# Patient Record
Sex: Female | Born: 2000 | Race: White | Hispanic: No | Marital: Single | Smoking: Never smoker
Health system: Southern US, Community
[De-identification: ages and names within clinical notes are randomized; demographics above are authoritative.]

## PROBLEM LIST (undated history)

## (undated) ENCOUNTER — Inpatient Hospital Stay (HOSPITAL_COMMUNITY): Payer: Self-pay

## (undated) DIAGNOSIS — H6690 Otitis media, unspecified, unspecified ear: Secondary | ICD-10-CM

## (undated) DIAGNOSIS — F411 Generalized anxiety disorder: Secondary | ICD-10-CM

## (undated) DIAGNOSIS — L309 Dermatitis, unspecified: Secondary | ICD-10-CM

## (undated) DIAGNOSIS — J45909 Unspecified asthma, uncomplicated: Secondary | ICD-10-CM

## (undated) DIAGNOSIS — F909 Attention-deficit hyperactivity disorder, unspecified type: Secondary | ICD-10-CM

## (undated) HISTORY — DX: Unspecified asthma, uncomplicated: J45.909

## (undated) HISTORY — DX: Generalized anxiety disorder: F41.1

## (undated) HISTORY — PX: TONSILLECTOMY: SUR1361

---
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
## 2020-08-10 ENCOUNTER — Telehealth: Payer: Medicaid Other | Admitting: Emergency Medicine

## 2020-08-10 DIAGNOSIS — J069 Acute upper respiratory infection, unspecified: Secondary | ICD-10-CM | POA: Diagnosis not present

## 2020-08-10 MED ORDER — BENZONATATE 100 MG PO CAPS: 100.0000 mg | ORAL_CAPSULE | Freq: Two times a day (BID) | ORAL | 0 refills | Status: DC | PRN

## 2020-08-10 MED ORDER — FLUTICASONE PROPIONATE 50 MCG/ACT NA SUSP: 2.0000 | Freq: Every day | NASAL | 0 refills | Status: DC

## 2020-08-10 NOTE — Progress Notes (Signed)

## 2020-08-17 ENCOUNTER — Other Ambulatory Visit: Payer: Self-pay | Admitting: Physician Assistant

## 2020-08-17 ENCOUNTER — Telehealth: Payer: Medicaid Other | Admitting: Physician Assistant

## 2020-08-17 DIAGNOSIS — R0989 Other specified symptoms and signs involving the circulatory and respiratory systems: Secondary | ICD-10-CM

## 2020-08-17 DIAGNOSIS — B9689 Other specified bacterial agents as the cause of diseases classified elsewhere: Secondary | ICD-10-CM

## 2020-08-17 DIAGNOSIS — R059 Cough, unspecified: Secondary | ICD-10-CM

## 2020-08-17 DIAGNOSIS — J208 Acute bronchitis due to other specified organisms: Secondary | ICD-10-CM

## 2020-08-17 MED ORDER — ALBUTEROL SULFATE HFA 108 (90 BASE) MCG/ACT IN AERS: 2.0000 | INHALATION_SPRAY | Freq: Four times a day (QID) | RESPIRATORY_TRACT | 0 refills | Status: DC | PRN

## 2020-08-17 MED ORDER — ALBUTEROL SULFATE HFA 108 (90 BASE) MCG/ACT IN AERS: 1.0000 | INHALATION_SPRAY | Freq: Four times a day (QID) | RESPIRATORY_TRACT | 0 refills | Status: DC | PRN

## 2020-08-17 MED ORDER — PREDNISONE 10 MG (21) PO TBPK: ORAL_TABLET | ORAL | 0 refills | Status: DC

## 2020-08-17 MED ORDER — BENZONATATE 100 MG PO CAPS: 100.0000 mg | ORAL_CAPSULE | Freq: Three times a day (TID) | ORAL | 0 refills | Status: DC | PRN

## 2020-08-17 MED ORDER — AZITHROMYCIN 250 MG PO TABS: ORAL_TABLET | ORAL | 0 refills | Status: DC

## 2020-08-17 NOTE — Addendum Note (Signed)
Addended by: Margaretann Loveless on: 08/17/2020 10:15 AM   Modules accepted: Orders

## 2020-08-17 NOTE — Progress Notes (Signed)
We are sorry that you are not feeling well.  Here is how we plan to help!  Based on your presentation I believe you most likely have A cough due to bacteria.  When patients have a fever and a productive cough with a change in color or increased sputum production, we are concerned about bacterial bronchitis.  If left untreated it can progress to pneumonia.  If your symptoms do not improve with your treatment plan it is important that you contact your provider.   I have prescribed Azithromyin 250 mg: two tablets now and then one tablet daily for 4 additonal days    In addition you may use A prescription cough medication called Tessalon Perles 100mg . You may take 1-2 capsules every 8 hours as needed for your cough.  Prednisone 10 mg daily for 6 days (see taper instructions below)  Directions for 6 day taper: Day 1: 2 tablets before breakfast, 1 after both lunch & dinner and 2 at bedtime Day 2: 1 tab before breakfast, 1 after both lunch & dinner and 2 at bedtime Day 3: 1 tab at each meal & 1 at bedtime Day 4: 1 tab at breakfast, 1 at lunch, 1 at bedtime Day 5: 1 tab at breakfast & 1 tab at bedtime Day 6: 1 tab at breakfast   From your responses in the eVisit questionnaire you describe inflammation in the upper respiratory tract which is causing a significant cough.  This is commonly called Bronchitis and has four common causes:    Allergies  Viral Infections  Acid Reflux  Bacterial Infection Allergies, viruses and acid reflux are treated by controlling symptoms or eliminating the cause. An example might be a cough caused by taking certain blood pressure medications. You stop the cough by changing the medication. Another example might be a cough caused by acid reflux. Controlling the reflux helps control the cough.  USE OF BRONCHODILATOR ("RESCUE") INHALERS: There is a risk from using your bronchodilator too frequently.  The risk is that over-reliance on a medication which only relaxes the  muscles surrounding the breathing tubes can reduce the effectiveness of medications prescribed to reduce swelling and congestion of the tubes themselves.  Although you feel brief relief from the bronchodilator inhaler, your asthma may actually be worsening with the tubes becoming more swollen and filled with mucus.  This can delay other crucial treatments, such as oral steroid medications. If you need to use a bronchodilator inhaler daily, several times per day, you should discuss this with your provider.  There are probably better treatments that could be used to keep your asthma under control.     HOME CARE . Only take medications as instructed by your medical team. . Complete the entire course of an antibiotic. . Drink plenty of fluids and get plenty of rest. . Avoid close contacts especially the very young and the elderly . Cover your mouth if you cough or cough into your sleeve. . Always remember to wash your hands . A steam or ultrasonic humidifier can help congestion.   GET HELP RIGHT AWAY IF: . You develop worsening fever. . You become short of breath . You cough up blood. . Your symptoms persist after you have completed your treatment plan MAKE SURE YOU   Understand these instructions.  Will watch your condition.  Will get help right away if you are not doing well or get worse.  Your e-visit answers were reviewed by a board certified advanced clinical practitioner to complete your  personal care plan.  Depending on the condition, your plan could have included both over the counter or prescription medications. If there is a problem please reply  once you have received a response from your provider. Your safety is important to us.  If you have drug allergies check your prescription carefully.    You can use MyChart to ask questions about today's visit, request a non-urgent call back, or ask for a work or school excuse for 24 hours related to this e-Visit. If it has been greater than  24 hours you will need to follow up with your provider, or enter a new e-Visit to address those concerns. You will get an e-mail in the next two days asking about your experience.  I hope that your e-visit has been valuable and will speed your recovery. Thank you for using e-visits.  I provided 6 minutes of non face-to-face time during this encounter for chart review and documentation.   

## 2020-12-12 ENCOUNTER — Encounter: Payer: Self-pay | Admitting: Family Medicine
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
## 2020-12-19 ENCOUNTER — Other Ambulatory Visit: Payer: Self-pay

## 2020-12-19 ENCOUNTER — Telehealth (INDEPENDENT_AMBULATORY_CARE_PROVIDER_SITE_OTHER): Payer: Medicaid Other | Admitting: Family Medicine

## 2020-12-19 DIAGNOSIS — F411 Generalized anxiety disorder: Secondary | ICD-10-CM | POA: Diagnosis not present

## 2020-12-19 MED ORDER — VENLAFAXINE HCL ER 75 MG PO CP24: 75.0000 mg | ORAL_CAPSULE | Freq: Every day | ORAL | 3 refills | Status: DC

## 2020-12-24 ENCOUNTER — Telehealth: Payer: Self-pay | Admitting: Family Medicine

## 2020-12-24 NOTE — Telephone Encounter (Signed)
Patient states she has an UTI but can't come in until possibly Wednesday if not later (due to her kids starting school), She is wondering if she can get something for it without having to come in. Or if she needs to come in, could she get something until she is able to. Please advise.

## 2020-12-25 NOTE — Telephone Encounter (Signed)
Called pt to schedule an appointment and she stated she would call back to schedule it.

## 2020-12-31 ENCOUNTER — Telehealth: Payer: Medicaid Other | Admitting: Physician Assistant

## 2020-12-31 DIAGNOSIS — J069 Acute upper respiratory infection, unspecified: Secondary | ICD-10-CM

## 2020-12-31 MED ORDER — BENZONATATE 100 MG PO CAPS: 100.0000 mg | ORAL_CAPSULE | Freq: Three times a day (TID) | ORAL | 0 refills | Status: DC | PRN

## 2020-12-31 MED ORDER — IPRATROPIUM BROMIDE 0.03 % NA SOLN: 2.0000 | Freq: Two times a day (BID) | NASAL | 12 refills | Status: DC

## 2020-12-31 NOTE — Progress Notes (Signed)
E-Visit for Upper Respiratory Infection   We are sorry you are not feeling well.  Here is how we plan to help!  Based on what you have shared with me, it looks like you may have a viral upper respiratory infection.  Upper respiratory infections are caused by a large number of viruses; however, rhinovirus is the most common cause.   Symptoms vary from person to person, with common symptoms including sore throat, cough, fatigue or lack of energy and feeling of general discomfort.  A low-grade fever of up to 100.4 may present, but is often uncommon.  Symptoms vary however, and are closely related to a person's age or underlying illnesses.  The most common symptoms associated with an upper respiratory infection are nasal discharge or congestion, cough, sneezing, headache and pressure in the ears and face.  These symptoms usually persist for about 3 to 10 days, but can last up to 2 weeks.  It is important to know that upper respiratory infections do not cause serious illness or complications in most cases.    Upper respiratory infections can be transmitted from person to person, with the most common method of transmission being a person's hands.  The virus is able to live on the skin and can infect other persons for up to 2 hours after direct contact.  Also, these can be transmitted when someone coughs or sneezes; thus, it is important to cover the mouth to reduce this risk.  To keep the spread of the illness at [place], good hand hygiene is very important.  This is an infection that is most likely caused by a virus. There are no specific treatments other than to help you with the symptoms until the infection runs its course.  We are sorry you are not feeling well.  Here is how we plan to help!   For nasal congestion, you may use an oral decongestants such as Mucinex D or if you have glaucoma or high blood pressure use plain Mucinex.  Saline nasal spray or nasal drops can help and can safely be used as often as  needed for congestion.  For your congestion, I have prescribed Ipratropium Bromide nasal spray 0.03% two sprays in each nostril 2-3 times a day  If you do not have a history of heart disease, hypertension, diabetes or thyroid disease, prostate/bladder issues or glaucoma, you may also use Sudafed to treat nasal congestion.  It is highly recommended that you consult with a pharmacist or your primary care physician to ensure this medication is safe for you to take.     If you have a cough, you may use cough suppressants such as Delsym and Robitussin.  If you have glaucoma or high blood pressure, you can also use Coricidin HBP.   For cough I have prescribed for you A prescription cough medication called Tessalon Perles 100 mg. You may take 1-2 capsules every 8 hours as needed for cough  If you have a sore or scratchy throat, use a saltwater gargle-  to  teaspoon of salt dissolved in a 4-ounce to 8-ounce glass of warm water.  Gargle the solution for approximately 15-30 seconds and then spit.  It is important not to swallow the solution.  You can also use throat lozenges/cough drops and Chloraseptic spray to help with throat pain or discomfort.  Warm or cold liquids can also be helpful in relieving throat pain.  For headache, pain or general discomfort, you can use Ibuprofen or Tylenol as directed.  Some authorities believe that zinc sprays or the use of Echinacea may shorten the course of your symptoms.  I would recommend to continue testing for Covid 19 over the next few days since you have had an exposure and are having symptoms.   HOME CARE Only take medications as instructed by your medical team. Be sure to drink plenty of fluids. Water is fine as well as fruit juices, sodas and electrolyte beverages. You may want to stay away from caffeine or alcohol. If you are nauseated, try taking small sips of liquids. How do you know if you are getting enough fluid? Your urine should be a pale yellow or  almost colorless. Get rest. Taking a steamy shower or using a humidifier may help nasal congestion and ease sore throat pain. You can place a towel over your head and breathe in the steam from hot water coming from a faucet. Using a saline nasal spray works much the same way. Cough drops, hard candies and sore throat lozenges may ease your cough. Avoid close contacts especially the very young and the elderly Cover your mouth if you cough or sneeze Always remember to wash your hands.   GET HELP RIGHT AWAY IF: You develop worsening fever. If your symptoms do not improve within 10 days You develop yellow or green discharge from your nose over 3 days. You have coughing fits You develop a severe head ache or visual changes. You develop shortness of breath, difficulty breathing or start having chest pain Your symptoms persist after you have completed your treatment plan  MAKE SURE YOU  Understand these instructions. Will watch your condition. Will get help right away if you are not doing well or get worse.  Thank you for choosing an e-visit.  Your e-visit answers were reviewed by a board certified advanced clinical practitioner to complete your personal care plan. Depending upon the condition, your plan could have included both over the counter or prescription medications.  Please review your pharmacy choice. Make sure the pharmacy is open so you can pick up prescription now. If there is a problem, you may contact your provider through [employer] and have the prescription routed to another pharmacy.  Your safety is important to [place]. If you have drug allergies check your prescription carefully.   For the next 24 hours you can use MyChart to ask questions about today's visit, request a non-urgent call back, or ask for a work or school excuse. You will get an email in the next two days asking about your experience. I hope that your e-visit has been valuable and will speed your  recovery.    I provided 5 minutes of non face-to-face time during this encounter for chart review and documentation.

## 2021-02-20 DIAGNOSIS — Z880 Allergy status to penicillin: Secondary | ICD-10-CM | POA: Diagnosis not present

## 2021-02-20 DIAGNOSIS — R000 Tachycardia, unspecified: Secondary | ICD-10-CM | POA: Diagnosis not present

## 2021-02-20 DIAGNOSIS — R0602 Shortness of breath: Secondary | ICD-10-CM | POA: Diagnosis not present

## 2021-02-20 DIAGNOSIS — R5381 Other malaise: Secondary | ICD-10-CM | POA: Diagnosis not present

## 2021-02-20 DIAGNOSIS — R059 Cough, unspecified: Secondary | ICD-10-CM | POA: Diagnosis not present

## 2021-02-20 DIAGNOSIS — Z20822 Contact with and (suspected) exposure to covid-19: Secondary | ICD-10-CM | POA: Diagnosis not present

## 2021-02-22 DIAGNOSIS — J101 Influenza due to other identified influenza virus with other respiratory manifestations: Secondary | ICD-10-CM | POA: Diagnosis not present

## 2021-02-22 DIAGNOSIS — Z20822 Contact with and (suspected) exposure to covid-19: Secondary | ICD-10-CM | POA: Diagnosis not present

## 2021-02-22 DIAGNOSIS — R0602 Shortness of breath: Secondary | ICD-10-CM | POA: Diagnosis not present

## 2021-02-22 DIAGNOSIS — R509 Fever, unspecified: Secondary | ICD-10-CM | POA: Diagnosis not present

## 2021-03-10 ENCOUNTER — Encounter: Payer: Self-pay | Admitting: Family Medicine

## 2021-03-11 DIAGNOSIS — F17200 Nicotine dependence, unspecified, uncomplicated: Secondary | ICD-10-CM | POA: Diagnosis not present

## 2021-03-11 DIAGNOSIS — F17210 Nicotine dependence, cigarettes, uncomplicated: Secondary | ICD-10-CM | POA: Diagnosis not present

## 2021-03-11 DIAGNOSIS — Z3200 Encounter for pregnancy test, result unknown: Secondary | ICD-10-CM | POA: Diagnosis not present

## 2021-03-11 DIAGNOSIS — Z6832 Body mass index (BMI) 32.0-32.9, adult: Secondary | ICD-10-CM | POA: Diagnosis not present

## 2021-03-11 DIAGNOSIS — N390 Urinary tract infection, site not specified: Secondary | ICD-10-CM | POA: Diagnosis not present

## 2021-03-11 DIAGNOSIS — N760 Acute vaginitis: Secondary | ICD-10-CM | POA: Diagnosis not present

## 2021-03-11 DIAGNOSIS — N3000 Acute cystitis without hematuria: Secondary | ICD-10-CM | POA: Diagnosis not present

## 2021-03-11 DIAGNOSIS — B9689 Other specified bacterial agents as the cause of diseases classified elsewhere: Secondary | ICD-10-CM | POA: Diagnosis not present

## 2021-03-11 DIAGNOSIS — Z7251 High risk heterosexual behavior: Secondary | ICD-10-CM | POA: Diagnosis not present

## 2021-03-12 ENCOUNTER — Encounter (HOSPITAL_BASED_OUTPATIENT_CLINIC_OR_DEPARTMENT_OTHER): Payer: Self-pay | Admitting: *Deleted

## 2021-03-12 ENCOUNTER — Emergency Department (HOSPITAL_BASED_OUTPATIENT_CLINIC_OR_DEPARTMENT_OTHER)
Admission: EM | Admit: 2021-03-12 | Discharge: 2021-03-13 | Disposition: A | Discharge status: Home / Self Care | Payer: Medicaid Other | Attending: Emergency Medicine | Admitting: Emergency Medicine

## 2021-03-12 ENCOUNTER — Encounter: Payer: Self-pay | Admitting: Family Medicine

## 2021-03-12 ENCOUNTER — Telehealth: Payer: Self-pay | Admitting: Family Medicine

## 2021-03-12 ENCOUNTER — Other Ambulatory Visit: Payer: Self-pay | Admitting: Family Medicine

## 2021-03-12 ENCOUNTER — Other Ambulatory Visit: Payer: Self-pay

## 2021-03-12 DIAGNOSIS — N83209 Unspecified ovarian cyst, unspecified side: Secondary | ICD-10-CM

## 2021-03-12 DIAGNOSIS — Z7951 Long term (current) use of inhaled steroids: Secondary | ICD-10-CM | POA: Diagnosis not present

## 2021-03-12 DIAGNOSIS — M545 Low back pain, unspecified: Secondary | ICD-10-CM | POA: Diagnosis not present

## 2021-03-12 DIAGNOSIS — R1110 Vomiting, unspecified: Secondary | ICD-10-CM | POA: Diagnosis not present

## 2021-03-12 DIAGNOSIS — N83201 Unspecified ovarian cyst, right side: Secondary | ICD-10-CM | POA: Diagnosis not present

## 2021-03-12 DIAGNOSIS — R350 Frequency of micturition: Secondary | ICD-10-CM | POA: Diagnosis present

## 2021-03-12 DIAGNOSIS — J45909 Unspecified asthma, uncomplicated: Secondary | ICD-10-CM | POA: Diagnosis not present

## 2021-03-12 DIAGNOSIS — R109 Unspecified abdominal pain: Secondary | ICD-10-CM | POA: Diagnosis not present

## 2021-03-12 LAB — URINALYSIS, ROUTINE W REFLEX MICROSCOPIC
Bilirubin Urine: NEGATIVE
Glucose, UA: NEGATIVE mg/dL
Ketones, ur: NEGATIVE mg/dL
Nitrite: NEGATIVE
Protein, ur: NEGATIVE mg/dL
Specific Gravity, Urine: 1.015 (ref 1.005–1.030)
pH: 6.5 (ref 5.0–8.0)

## 2021-03-12 LAB — URINALYSIS, MICROSCOPIC (REFLEX)

## 2021-03-12 LAB — PREGNANCY, URINE: Preg Test, Ur: NEGATIVE

## 2021-03-12 MED ORDER — KETOROLAC TROMETHAMINE 30 MG/ML IJ SOLN
30.0000 mg | Freq: Once | INTRAMUSCULAR | Status: AC
  Administered 2021-03-13: 30 mg via INTRAVENOUS
  Filled 2021-03-12: qty 1

## 2021-03-12 MED ORDER — LACTATED RINGERS IV BOLUS
1000.0000 mL | Freq: Once | INTRAVENOUS | Status: AC
  Administered 2021-03-13: 1000 mL via INTRAVENOUS

## 2021-03-12 MED ORDER — ONDANSETRON HCL 4 MG/2ML IJ SOLN
4.0000 mg | Freq: Once | INTRAMUSCULAR | Status: AC
  Administered 2021-03-13: 4 mg via INTRAVENOUS
  Filled 2021-03-12: qty 2

## 2021-03-12 MED ORDER — SULFAMETHOXAZOLE-TRIMETHOPRIM 800-160 MG PO TABS: 1.0000 | ORAL_TABLET | Freq: Two times a day (BID) | ORAL | 0 refills | Status: DC

## 2021-03-12 NOTE — ED Triage Notes (Signed)
Back pain x 2 weeks. She was seen by her MD and was started on antibiotics x 2 days for a severe UTI.

## 2021-03-12 NOTE — ED Notes (Signed)
Reports that she started having burning with urinating and frequency.  Pt. Reports it got worse and she saw Dr. On Monday and was placed on abx.  Pt. Reports today not feeling better only feeling worse.  Pt. Reports fever lastnight and feeling sick on her stomach with vomiting.  Pt. Reports L side of lower abd. Painful and swelling today.

## 2021-03-12 NOTE — ED Provider Notes (Signed)
[facility] EMERGENCY DEPARTMENT Provider Note   CSN: [id number] Arrival date & time: 03/12/21  2016     History Chief Complaint  Patient presents with   Back Pain    Cindy Townsend is a 20 y.o. female.  HPI     2 weeks ago developed, burning with urination, frequency, felt like it was thick, foul odor Monday went to [facility] center Diagnosed with UTI, started on nitrofurantoin, taken it for 2 days and feels like symptoms worsening For the last 2 days now having lower abdominal on the left side and feeling of fullness, pain the lower back on both sides, nausea, vomiting a few times, chills, last night 99. No diarrhea, or constipation, no vaginal discharge, no vaginal bleeding  Sexually active, not using condoms  Not have dysuria now as much but the pelvic pain and back pain is persistent   Past Medical History:  Diagnosis Date   ADHD (attention deficit hyperactivity disorder)    ADHD   Anxiety state 07/24/2014   Asthma    triggered by exercise and URI; prn inhaler   Chronic otitis media 05/2011    Patient Active Problem List   Diagnosis Date Noted   Normal labor 07/18/2018   Expectant parent prebirth pediatrician visit 06/22/2018   Pregnancy 11/15/2017   Contusion of left elbow 03/04/2016   Headache 05/27/2015   Pharyngitis 04/17/2015   Superficial phlebitis of arm 10/19/2014   Knee pain 09/24/2014   Anxiety state 07/24/2014   Allergic rhinitis 06/26/2014   Abdominal pain 02/27/2014   Encounter for repeat prescription of oral contraceptives 01/24/2014   Skin tag 01/24/2014   Conjunctivitis 05/13/2012   Urticaria 03/01/2012   Preventative health care 02/22/2012   ADD (attention deficit disorder) 02/22/2012   Asthma    Eczema     Past Surgical History:  Procedure Laterality Date   TONSILLECTOMY     TYMPANOSTOMY TUBE PLACEMENT  2012     OB History     Gravida  1   Para  1   Term  1   Preterm      AB      Living  1       SAB      IAB      Ectopic      Multiple  0   Live Births  1           Family History  Problem Relation Age of Onset   Asthma Mother    Anesthesia problems Mother        post-op nausea   Hypertension Maternal Grandmother    Asthma Maternal Grandmother    Diabetes Paternal Grandmother        type 2- controlled by diet    Social History   Tobacco Use   Smoking status: Never   Smokeless tobacco: Never  Vaping Use   Vaping Use: Never used  Substance Use Topics   Alcohol use: No   Drug use: No    Home Medications Prior to Admission medications   Medication Sig Start Date End Date Taking? Authorizing Provider  albuterol (PROAIR HFA) 108 (90 Base) MCG/ACT inhaler Inhale 1-2 puffs into the lungs every 6 (six) hours as needed for wheezing or shortness of breath. 08/17/20  Yes Burnette, Alessandra Bevels, PA-C  etonogestrel (NEXPLANON) 68 MG IMPL implant 1 each by Subdermal route once.   Yes [provider]  ipratropium (ATROVENT) 0.03 % nasal spray Place 2 sprays into both nostrils every  12 (twelve) hours. 12/31/20  Yes Margaretann Loveless, PA-C  nitrofurantoin (MACRODANTIN) 100 MG capsule Take 100 mg by mouth 2 (two) times daily. 03/11/21  Yes [provider]  venlafaxine XR (EFFEXOR XR) 75 MG 24 hr capsule Take 1 capsule (75 mg total) by mouth daily with breakfast. 12/19/20  Yes Copland, Gwenlyn Found, MD  sulfamethoxazole-trimethoprim (BACTRIM DS) 800-160 MG tablet Take 1 tablet by mouth 2 (two) times daily. 03/13/21   Palumbo, April, MD    Allergies    Ciprofloxacin, Penicillins, and Soap  Review of Systems   Review of Systems  Constitutional:  Positive for chills and fatigue. Negative for fever.  HENT:  Negative for sore throat.   Respiratory:  Negative for cough and shortness of breath.   Cardiovascular:  Negative for chest pain.  Gastrointestinal:  Positive for abdominal pain, nausea and vomiting.  Genitourinary:  Positive for dysuria and pelvic pain.  Negative for difficulty urinating, vaginal bleeding and vaginal discharge.  Musculoskeletal:  Positive for back pain. Negative for neck pain.  Skin:  Negative for rash.  Neurological:  Negative for syncope, weakness, light-headedness, numbness and headaches.   Physical Exam Updated Vital Signs BP 112/69 (BP Location: Right Arm)   Pulse 64   Temp 98.4 F (36.9 C) (Oral)   Resp 18   Ht 5\' 3"  (1.6 m)   Wt 56.7 kg   SpO2 99%   BMI 22.14 kg/m   Physical Exam Vitals and nursing note reviewed.  Constitutional:      General: She is not in acute distress.    Appearance: She is well-developed. She is not diaphoretic.  HENT:     Head: Normocephalic and atraumatic.  Eyes:     Conjunctiva/sclera: Conjunctivae normal.  Cardiovascular:     Rate and Rhythm: Normal rate and regular rhythm.     Heart sounds: Normal heart sounds. No murmur heard.   No friction rub. No gallop.  Pulmonary:     Effort: Pulmonary effort is normal. No respiratory distress.     Breath sounds: Normal breath sounds. No wheezing or rales.  Abdominal:     General: There is no distension.     Palpations: Abdomen is soft.     Tenderness: There is abdominal tenderness (reports some left side but has more significant tenderness right side). There is no guarding.  Musculoskeletal:        General: No tenderness.     Cervical back: Normal range of motion.  Skin:    General: Skin is warm and dry.     Findings: No erythema or rash.  Neurological:     Mental Status: She is alert and oriented to person, place, and time.    ED Results / Procedures / Treatments   Labs (all labs ordered are listed, but only abnormal results are displayed) Labs Reviewed  WET PREP, GENITAL - Abnormal; Notable for the following components:      Result Value   Clue Cells Wet Prep HPF POC PRESENT (*)    WBC, Wet Prep HPF POC >=10 (*)    All other components within normal limits  URINALYSIS, ROUTINE W REFLEX MICROSCOPIC - Abnormal; Notable  for the following components:   Hgb urine dipstick TRACE (*)    Leukocytes,Ua TRACE (*)    All other components within normal limits  URINALYSIS, MICROSCOPIC (REFLEX) - Abnormal; Notable for the following components:   Bacteria, UA RARE (*)    All other components within normal limits  COMPREHENSIVE METABOLIC PANEL -  Abnormal; Notable for the following components:   CO2 21 (*)    AST 14 (*)    All other components within normal limits  PREGNANCY, URINE  CBC WITH DIFFERENTIAL/PLATELET  LIPASE, BLOOD  GC/CHLAMYDIA PROBE AMP ([place]) NOT AT [facility]    EKG None  Radiology CT ABDOMEN PELVIS W CONTRAST  Result Date: 03/13/2021 CLINICAL DATA:  Abdominal pain, acute, nonlocalized. Back pain. Nausea, vomiting. EXAM: CT ABDOMEN AND PELVIS WITH CONTRAST TECHNIQUE: Multidetector CT imaging of the abdomen and pelvis was performed using the standard protocol following bolus administration of intravenous contrast. CONTRAST:  OMNIPAQUE IOHEXOL 300 MG/ML  SOLN COMPARISON:  None. FINDINGS: Lower chest: No acute abnormality. Hepatobiliary: No focal liver abnormality is seen. No gallstones, gallbladder wall thickening, or biliary dilatation. Pancreas: Unremarkable. No pancreatic ductal dilatation or surrounding inflammatory changes. Spleen: Normal in size without focal abnormality. Adrenals/Urinary Tract: The adrenal glands are unremarkable. The kidneys are normal in size and position. Minimal bilateral nonobstructing nephrolithiasis is present with punctate 1-2 mm nonobstructing calculi noted within the lower pole of the right kidney and interpolar region of the left kidney. The kidneys are otherwise unremarkable. The bladder is unremarkable. Stomach/Bowel: Stomach is within normal limits. Appendix appears normal. No evidence of bowel wall thickening, distention, or inflammatory changes. No free intraperitoneal gas. Vascular/Lymphatic: No significant vascular findings are present. No enlarged abdominal  or pelvic lymph nodes. Reproductive: A 3.3 cm simple appearing cyst is seen within the right ovary. The pelvic organs are otherwise unremarkable. Other: No abdominal wall hernia or abnormality. No abdominopelvic ascites. Musculoskeletal: No acute or significant osseous findings. IMPRESSION: No acute intra-abdominal pathology identified. No definite radiographic explanation for the patient's reported symptoms. Minimal bilateral nonobstructing nephrolithiasis. 3.3 cm right ovarian simple-appearing cyst. No follow-up imaging is recommended. Reference: JACR 2020 Feb;17(2):248-254 Electronically Signed   By: Helyn Numbers M.D.   On: 03/13/2021 01:08    Procedures Procedures   Medications Ordered in ED Medications  lactated ringers bolus 1,000 mL ( Intravenous Stopped 03/13/21 0134)  ondansetron (ZOFRAN) injection 4 mg (4 mg Intravenous Given 03/13/21 0026)  ketorolac (TORADOL) 30 MG/ML injection 30 mg (30 mg Intravenous Given 03/13/21 0026)  iohexol (OMNIPAQUE) 300 MG/ML solution 100 mL (100 mLs Intravenous Contrast Given 03/13/21 0049)    ED Course  I have reviewed the triage vital signs and the nursing notes.  Pertinent labs & imaging results that were available during my care of the patient were reviewed by me and considered in my medical decision making (see chart for details).    MDM Rules/Calculators/A&P                           Irena Reichmann female with history above presents with concern for 2 week of urinary symptoms now with lower abdominal pain, nausea, chills and back pain. Treatment with nitrofurantoin was initiated by PCP.  UA today is not consistent with urinary tract infection.  Pelvic exam without significant uterine tenderness, overall lower suspicion for PID. Pain not colicky, history not consistent with ovarian torsion.  Pregnancy test negative.  Does have RLQ tenderness on exam, will order CT abdomen pelvis for further evaluation. Signed out to Dr. Nicanor Alcon with imaging pending.     Final Clinical Impression(s) / ED Diagnoses Final diagnoses:  Cyst of ovary, unspecified laterality    Rx / DC Orders ED Discharge Orders          Ordered    sulfamethoxazole-trimethoprim (BACTRIM DS) 800-160  MG tablet  2 times daily        03/13/21 0133             Alvira Monday, MD 03/13/21 2355

## 2021-03-12 NOTE — Telephone Encounter (Signed)
Patient is calling because she had UTI symptoms that started 2 week or more ago, and on the 13th she started having a lot of back pain and got sick. Patient went to [facility] center and they gave her Macrodancin stating that she had a really bad UTI and this would help her. She states that she's on the third pill an the medicine is giving her bad headaches and she still has no relief. She was offered an appointment but she wants to see what Dr. Abner Greenspan recommends first. Please advice.

## 2021-03-13 ENCOUNTER — Emergency Department (HOSPITAL_BASED_OUTPATIENT_CLINIC_OR_DEPARTMENT_OTHER): Payer: Medicaid Other

## 2021-03-13 DIAGNOSIS — R1110 Vomiting, unspecified: Secondary | ICD-10-CM | POA: Diagnosis not present

## 2021-03-13 DIAGNOSIS — R109 Unspecified abdominal pain: Secondary | ICD-10-CM | POA: Diagnosis not present

## 2021-03-13 LAB — CBC WITH DIFFERENTIAL/PLATELET
Abs Immature Granulocytes: 0.02 10*3/uL (ref 0.00–0.07)
Basophils Absolute: 0 10*3/uL (ref 0.0–0.1)
Basophils Relative: 0 %
Eosinophils Absolute: 0.1 10*3/uL (ref 0.0–0.5)
Eosinophils Relative: 1 %
HCT: 39.7 % (ref 36.0–46.0)
Hemoglobin: 13.3 g/dL (ref 12.0–15.0)
Immature Granulocytes: 0 %
Lymphocytes Relative: 40 %
Lymphs Abs: 3.7 10*3/uL (ref 0.7–4.0)
MCH: 29.4 pg (ref 26.0–34.0)
MCHC: 33.5 g/dL (ref 30.0–36.0)
MCV: 87.8 fL (ref 80.0–100.0)
Monocytes Absolute: 0.4 10*3/uL (ref 0.1–1.0)
Monocytes Relative: 5 %
Neutro Abs: 5 10*3/uL (ref 1.7–7.7)
Neutrophils Relative %: 54 %
Platelets: 351 10*3/uL (ref 150–400)
RBC: 4.52 MIL/uL (ref 3.87–5.11)
RDW: 12.4 % (ref 11.5–15.5)
WBC: 9.2 10*3/uL (ref 4.0–10.5)
nRBC: 0 % (ref 0.0–0.2)

## 2021-03-13 LAB — COMPREHENSIVE METABOLIC PANEL
ALT: 15 U/L (ref 0–44)
AST: 14 U/L — ABNORMAL LOW (ref 15–41)
Albumin: 4.3 g/dL (ref 3.5–5.0)
Alkaline Phosphatase: 55 U/L (ref 38–126)
Anion gap: 9 (ref 5–15)
BUN: 12 mg/dL (ref 6–20)
CO2: 21 mmol/L — ABNORMAL LOW (ref 22–32)
Calcium: 9 mg/dL (ref 8.9–10.3)
Chloride: 105 mmol/L (ref 98–111)
Creatinine, Ser: 0.81 mg/dL (ref 0.44–1.00)
GFR, Estimated: >60 mL/min (ref >60)
Glucose, Bld: 87 mg/dL (ref 70–99)
Potassium: 3.5 mmol/L (ref 3.5–5.1)
Sodium: 135 mmol/L (ref 135–145)
Total Bilirubin: 0.6 mg/dL (ref 0.3–1.2)
Total Protein: 7.3 g/dL (ref 6.5–8.1)

## 2021-03-13 LAB — WET PREP, GENITAL
Sperm: NONE SEEN
Trich, Wet Prep: NONE SEEN
WBC, Wet Prep HPF POC: >=10 — AB (ref <10)
Yeast Wet Prep HPF POC: NONE SEEN

## 2021-03-13 LAB — LIPASE, BLOOD: Lipase: 31 U/L (ref 11–51)

## 2021-03-13 MED ORDER — IOHEXOL 300 MG/ML  SOLN
100.0000 mL | Freq: Once | INTRAMUSCULAR | Status: AC | PRN
  Administered 2021-03-13: 100 mL via INTRAVENOUS

## 2021-03-13 MED ORDER — SULFAMETHOXAZOLE-TRIMETHOPRIM 800-160 MG PO TABS: 1.0000 | ORAL_TABLET | Freq: Two times a day (BID) | ORAL | 0 refills | Status: DC

## 2021-03-13 NOTE — Telephone Encounter (Signed)
Lmv to see if she could do a vv on Friday

## 2021-03-14 ENCOUNTER — Telehealth: Payer: Self-pay

## 2021-03-14 LAB — GC/CHLAMYDIA PROBE AMP (~~LOC~~) NOT AT ARMC
Chlamydia: NEGATIVE
Comment: NEGATIVE
Comment: NORMAL
Neisseria Gonorrhea: NEGATIVE

## 2021-03-14 NOTE — Telephone Encounter (Signed)
Transition Care Management Follow-up Telephone Call Date of discharge and from where: 03/13/2021-[facility]  How have you been since you were released from the hospital? Patient stated she is doing fine. Patient was advised to schedule a follow up with PCP.  Any questions or concerns? No  Items Reviewed: Did the pt receive and understand the discharge instructions provided? Yes  Medications obtained and verified? Yes  Other? No  Any new allergies since your discharge? No  Dietary orders reviewed? No Do you have support at home? Yes   Home Care and Equipment/Supplies: Were home health services ordered? not applicable If so, what is the name of the agency? N/A  Has the agency set up a time to come to the patient's home? not applicable Were any new equipment or medical supplies ordered?  No What is the name of the medical supply agency? N/A Were you able to get the supplies/equipment? not applicable Do you have any questions related to the use of the equipment or supplies? No  Functional Questionnaire: (I = Independent and D = Dependent) ADLs: I  Bathing/Dressing- I  Meal Prep- I  Eating- I  Maintaining continence- I  Transferring/Ambulation- I  Managing Meds- I  Follow up appointments reviewed:  PCP Hospital f/u appt confirmed? No   Specialist Hospital f/u appt confirmed? No   Are transportation arrangements needed? No  If their condition worsens, is the pt aware to call PCP or go to the Emergency Dept.? Yes Was the patient provided with contact information for the PCP's office or ED? Yes Was to pt encouraged to call back with questions or concerns? Yes

## 2021-03-19 ENCOUNTER — Encounter: Payer: Self-pay | Admitting: Family Medicine

## 2021-03-19 ENCOUNTER — Telehealth: Payer: Self-pay | Admitting: Family Medicine

## 2021-03-19 NOTE — Telephone Encounter (Signed)
Patient is calling because she went to the emergency room with a UTI and a kidney infection. She states she's on the last of her antibiotics and she is still burning when she pees. She would like to know what else she could do. Please advice.

## 2021-03-20 NOTE — Telephone Encounter (Signed)
Patient was treated at ER and was given Bactrim DS.  Looks like no culture was done.

## 2021-03-20 NOTE — Telephone Encounter (Signed)
Dr. Patsy Lager sent patient a mychart message yesterday.

## 2021-05-17 ENCOUNTER — Other Ambulatory Visit: Payer: Self-pay

## 2021-05-17 ENCOUNTER — Emergency Department (HOSPITAL_COMMUNITY)
Admission: EM | Admit: 2021-05-17 | Discharge: 2021-05-18 | Disposition: A | Discharge status: Home / Self Care | Payer: Medicaid Other | Attending: Emergency Medicine | Admitting: Emergency Medicine

## 2021-05-17 ENCOUNTER — Encounter (HOSPITAL_COMMUNITY): Payer: Self-pay

## 2021-05-17 DIAGNOSIS — J45909 Unspecified asthma, uncomplicated: Secondary | ICD-10-CM | POA: Insufficient documentation

## 2021-05-17 DIAGNOSIS — R0602 Shortness of breath: Secondary | ICD-10-CM

## 2021-05-17 DIAGNOSIS — U071 COVID-19: Secondary | ICD-10-CM | POA: Diagnosis not present

## 2021-05-17 DIAGNOSIS — Z28310 Unvaccinated for covid-19: Secondary | ICD-10-CM | POA: Diagnosis not present

## 2021-05-17 DIAGNOSIS — R059 Cough, unspecified: Secondary | ICD-10-CM | POA: Diagnosis not present

## 2021-05-17 MED ORDER — ALBUTEROL SULFATE HFA 108 (90 BASE) MCG/ACT IN AERS
4.0000 | INHALATION_SPRAY | RESPIRATORY_TRACT | Status: AC
  Administered 2021-05-18: 4 via RESPIRATORY_TRACT
  Filled 2021-05-17: qty 6.7

## 2021-05-17 NOTE — ED Triage Notes (Signed)
Pt reports covid exposure and positive home test. Generalized body aches, nonproductive cough and mild s hob beginning  yesterday.

## 2021-05-18 ENCOUNTER — Emergency Department (HOSPITAL_COMMUNITY): Payer: Medicaid Other

## 2021-05-18 DIAGNOSIS — R059 Cough, unspecified: Secondary | ICD-10-CM | POA: Diagnosis not present

## 2021-05-18 DIAGNOSIS — U071 COVID-19: Secondary | ICD-10-CM | POA: Diagnosis not present

## 2021-05-18 LAB — RESP PANEL BY RT-PCR (FLU A&B, COVID) ARPGX2
Influenza A by PCR: NEGATIVE
Influenza B by PCR: NEGATIVE
SARS Coronavirus 2 by RT PCR: POSITIVE — AB

## 2021-05-18 NOTE — ED Provider Notes (Signed)
[facility]-EMERGENCY DEPT Provider Note   CSN: [id number] Arrival date & time: 05/17/21  2328     History  Chief Complaint  Patient presents with   Covid Exposure    Cindy Townsend is a 21 y.o. female.  HPI    21 year old female comes in with chief complaint of COVID-19.  She reports about 2 to 3-day history of runny nose and URI-like symptoms.  Earlier today she started having some shortness of breath.  She has history of asthma.  She is certain she is not pregnant.  She is not vaccinated against COVID-19.  She also reports body aches, cough.  No treatment taken at home  Home Medications Prior to Admission medications   Medication Sig Start Date End Date Taking? Authorizing Provider  albuterol (PROAIR HFA) 108 (90 Base) MCG/ACT inhaler Inhale 1-2 puffs into the lungs every 6 (six) hours as needed for wheezing or shortness of breath. 08/17/20   Margaretann Loveless, PA-C  etonogestrel (NEXPLANON) 68 MG IMPL implant 1 each by Subdermal route once.    [provider]  ipratropium (ATROVENT) 0.03 % nasal spray Place 2 sprays into both nostrils every 12 (twelve) hours. 12/31/20   Margaretann Loveless, PA-C  nitrofurantoin (MACRODANTIN) 100 MG capsule Take 100 mg by mouth 2 (two) times daily. 03/11/21   [provider]  sulfamethoxazole-trimethoprim (BACTRIM DS) 800-160 MG tablet Take 1 tablet by mouth 2 (two) times daily. 03/13/21   Palumbo, April, MD  venlafaxine XR (EFFEXOR XR) 75 MG 24 hr capsule Take 1 capsule (75 mg total) by mouth daily with breakfast. 12/19/20   Copland, Gwenlyn Found, MD      Allergies    Ciprofloxacin, Penicillins, and Soap    Review of Systems   Review of Systems  Constitutional:  Positive for activity change.  Respiratory:  Positive for shortness of breath.    Physical Exam Updated Vital Signs BP (!) 138/107    Pulse 95    Temp 98.5 F (36.9 C) (Oral)    Resp 18    Ht 5\' 3"  (1.6 m)    Wt 59 kg    SpO2 97%    BMI  23.03 kg/m  Physical Exam Vitals and nursing note reviewed.  Constitutional:      Appearance: She is well-developed.  HENT:     Head: Atraumatic.  Cardiovascular:     Rate and Rhythm: Normal rate.  Pulmonary:     Effort: Pulmonary effort is normal. No respiratory distress.     Breath sounds: No wheezing or rhonchi.  Musculoskeletal:     Cervical back: Normal range of motion and neck supple.  Skin:    General: Skin is warm and dry.  Neurological:     Mental Status: She is alert and oriented to person, place, and time.    ED Results / Procedures / Treatments   Labs (all labs ordered are listed, but only abnormal results are displayed) Labs Reviewed  RESP PANEL BY RT-PCR (FLU A&B, COVID) ARPGX2    EKG None  Radiology DG Chest Port 1 View  Result Date: 05/18/2021 CLINICAL DATA:  COVID positive home test with generalized body aches and nonproductive cough. EXAM: PORTABLE CHEST 1 VIEW COMPARISON:  February 22, 2021 FINDINGS: The heart size and mediastinal contours are within normal limits. Both lungs are clear. The visualized skeletal structures are unremarkable. IMPRESSION: No active disease. Electronically Signed   By: February 24, 2021 M.D.   On: 05/18/2021 00:41  Procedures Procedures    Medications Ordered in ED Medications  albuterol (VENTOLIN HFA) 108 (90 Base) MCG/ACT inhaler 4 puff (4 puffs Inhalation Given 05/18/21 0027)    ED Course/ Medical Decision Making/ A&P                           Medical Decision Making Number and complexity of problems evaluated during this encounter: - Moderate: 1 undiagnosed new problem with uncertain prognosis - 1 self limited/minor problem  Amount and/or Complexity of Data: - Unique tests ordered including labs and imaging: 2 - Test reviewed (review of imaging, EKGs): 1  Risk of Morbidity, Mortality, or Complications - Risk level: Low  -likely has COVID, unvaccinated and was symptomatic, but is otherwise healthy and  young.  Amount and/or Complexity of Data Reviewed Radiology: ordered and independent interpretation performed.  Risk Prescription drug management.   21 year old female with history of asthma comes in with chief complaint of URI-like symptoms.  She alleges that she had a home test that was positive.  Earlier today she started having some shortness of breath, prompting her to come to the ER.  Patient is not vaccinated.  She is feeling better now, and there is no respiratory distress.  Her lung exams are clear.   X-rays reviewed independently, does not show any evidence of acute consolidation or large pleural effusion.  Discussed Paxlovid -patient agrees with my assessment that she might not benefit significantly from getting the prescription for it.  We will focus on symptom management with albuterol.  Strict ER return precautions have been discussed, and patient is agreeing with the plan and is comfortable with the workup done and the recommendations from the ER.  Final Clinical Impression(s) / ED Diagnoses Final diagnoses:  COVID-19  Shortness of breath    Rx / DC Orders ED Discharge Orders     None         Derwood Kaplan, MD 05/18/21 0106

## 2021-05-18 NOTE — Discharge Instructions (Addendum)
You were tested for COVID-19 -we anticipate a positive result as you already tested positive at home.  Quarantine for 5 days after the onset of the symptoms to protect people around you.  If after 5 days you are feeling well, then you can return to the public, but you will have to keep mask on at all times for 5 more days to again protect the people around you.  If within the course of the illness you start having any shortness of breath, severe nausea and vomiting, please return to the ER.   Take Tylenol for body aches and albuterol inhaler can be used every 4-6 hours for chest tightness and wheezing.Marland Kitchen

## 2021-05-20 ENCOUNTER — Telehealth: Payer: Self-pay

## 2021-05-20 NOTE — Telephone Encounter (Signed)
Transition Care Management Follow-up Telephone Call Date of discharge and from where: 05/18/2021-[facility]  How have you been since you were released from the hospital? Patient stated she is doing fine  Any questions or concerns? No  Items Reviewed: Did the pt receive and understand the discharge instructions provided? Yes  Medications obtained and verified? Yes  Other? No  Any new allergies since your discharge? No  Dietary orders reviewed? No Do you have support at home? Yes   Home Care and Equipment/Supplies: Were home health services ordered? not applicable If so, what is the name of the agency? N/A  Has the agency set up a time to come to the patient's home? not applicable Were any new equipment or medical supplies ordered?  No What is the name of the medical supply agency? N/A Were you able to get the supplies/equipment? not applicable Do you have any questions related to the use of the equipment or supplies? No  Functional Questionnaire: (I = Independent and D = Dependent) ADLs: I  Bathing/Dressing- I  Meal Prep- I  Eating- I  Maintaining continence- I  Transferring/Ambulation- I  Managing Meds- I  Follow up appointments reviewed:  PCP Hospital f/u appt confirmed? No   Specialist Hospital f/u appt confirmed? No   Are transportation arrangements needed? No  If their condition worsens, is the pt aware to call PCP or go to the Emergency Dept.? Yes Was the patient provided with contact information for the PCP's office or ED? Yes Was to pt encouraged to call back with questions or concerns? Yes

## 2021-06-10 DIAGNOSIS — R110 Nausea: Secondary | ICD-10-CM | POA: Diagnosis not present

## 2021-06-10 DIAGNOSIS — G43909 Migraine, unspecified, not intractable, without status migrainosus: Secondary | ICD-10-CM | POA: Diagnosis not present

## 2021-06-10 DIAGNOSIS — Z20822 Contact with and (suspected) exposure to covid-19: Secondary | ICD-10-CM | POA: Diagnosis not present

## 2021-06-10 DIAGNOSIS — R509 Fever, unspecified: Secondary | ICD-10-CM | POA: Diagnosis not present

## 2021-06-11 ENCOUNTER — Other Ambulatory Visit: Payer: Self-pay

## 2021-06-11 ENCOUNTER — Encounter (HOSPITAL_BASED_OUTPATIENT_CLINIC_OR_DEPARTMENT_OTHER): Payer: Self-pay

## 2021-06-11 ENCOUNTER — Emergency Department (HOSPITAL_BASED_OUTPATIENT_CLINIC_OR_DEPARTMENT_OTHER)
Admission: EM | Admit: 2021-06-11 | Discharge: 2021-06-12 | Disposition: A | Discharge status: Home / Self Care | Payer: Medicaid Other | Attending: Emergency Medicine | Admitting: Emergency Medicine

## 2021-06-11 DIAGNOSIS — R55 Syncope and collapse: Secondary | ICD-10-CM

## 2021-06-11 DIAGNOSIS — R42 Dizziness and giddiness: Secondary | ICD-10-CM | POA: Insufficient documentation

## 2021-06-11 DIAGNOSIS — Z20822 Contact with and (suspected) exposure to covid-19: Secondary | ICD-10-CM | POA: Insufficient documentation

## 2021-06-11 DIAGNOSIS — R9431 Abnormal electrocardiogram [ECG] [EKG]: Secondary | ICD-10-CM | POA: Diagnosis not present

## 2021-06-11 DIAGNOSIS — R197 Diarrhea, unspecified: Secondary | ICD-10-CM | POA: Diagnosis not present

## 2021-06-11 DIAGNOSIS — J45909 Unspecified asthma, uncomplicated: Secondary | ICD-10-CM | POA: Diagnosis not present

## 2021-06-11 DIAGNOSIS — N10 Acute tubulo-interstitial nephritis: Secondary | ICD-10-CM | POA: Insufficient documentation

## 2021-06-11 DIAGNOSIS — R112 Nausea with vomiting, unspecified: Secondary | ICD-10-CM | POA: Insufficient documentation

## 2021-06-11 DIAGNOSIS — R000 Tachycardia, unspecified: Secondary | ICD-10-CM | POA: Diagnosis not present

## 2021-06-11 DIAGNOSIS — R1032 Left lower quadrant pain: Secondary | ICD-10-CM | POA: Insufficient documentation

## 2021-06-11 DIAGNOSIS — E860 Dehydration: Secondary | ICD-10-CM | POA: Diagnosis not present

## 2021-06-11 DIAGNOSIS — R1110 Vomiting, unspecified: Secondary | ICD-10-CM

## 2021-06-11 DIAGNOSIS — R509 Fever, unspecified: Secondary | ICD-10-CM | POA: Diagnosis not present

## 2021-06-11 DIAGNOSIS — N200 Calculus of kidney: Secondary | ICD-10-CM | POA: Diagnosis not present

## 2021-06-11 DIAGNOSIS — E876 Hypokalemia: Secondary | ICD-10-CM | POA: Diagnosis not present

## 2021-06-11 DIAGNOSIS — N12 Tubulo-interstitial nephritis, not specified as acute or chronic: Secondary | ICD-10-CM | POA: Diagnosis not present

## 2021-06-11 LAB — URINALYSIS, ROUTINE W REFLEX MICROSCOPIC
Bilirubin Urine: NEGATIVE
Glucose, UA: NEGATIVE mg/dL
Ketones, ur: >=80 mg/dL — AB
Nitrite: POSITIVE — AB
Protein, ur: 30 mg/dL — AB
Specific Gravity, Urine: 1.025 (ref 1.005–1.030)
pH: 5.5 (ref 5.0–8.0)

## 2021-06-11 LAB — URINALYSIS, MICROSCOPIC (REFLEX)

## 2021-06-11 LAB — PREGNANCY, URINE: Preg Test, Ur: NEGATIVE

## 2021-06-11 MED ORDER — ONDANSETRON HCL 4 MG/2ML IJ SOLN
4.0000 mg | Freq: Once | INTRAMUSCULAR | Status: AC
  Administered 2021-06-11: 4 mg via INTRAVENOUS
  Filled 2021-06-11: qty 2

## 2021-06-11 MED ORDER — LACTATED RINGERS IV BOLUS
1000.0000 mL | Freq: Once | INTRAVENOUS | Status: AC
  Administered 2021-06-11: 1000 mL via INTRAVENOUS

## 2021-06-11 MED ORDER — ACETAMINOPHEN 325 MG PO TABS
650.0000 mg | ORAL_TABLET | Freq: Once | ORAL | Status: AC
  Administered 2021-06-11: 650 mg via ORAL
  Filled 2021-06-11: qty 2

## 2021-06-11 NOTE — ED Triage Notes (Addendum)
Patient presents with complaint of feeling generally ill since Friday.  Patient had syncope episode with loss of consciousness around 1400 today.  Hit back of head on floor.  Patient has fever of 101.9 in triage.  Patient has been unable to eat food but has been drinking gatorade.  Patient also complains of vaginal bleeding for 1 month and states she has a gynecological implant.

## 2021-06-11 NOTE — ED Notes (Signed)
Dr. Wickline at bedside.  

## 2021-06-11 NOTE — ED Provider Notes (Signed)
[facility] EMERGENCY DEPARTMENT Provider Note   CSN: [id number] Arrival date & time: 06/11/21  2322     History  Chief Complaint  Patient presents with   Fever   Dizziness   Abdominal Pain   Nausea   Vaginal Bleeding    For 1 month    Cindy Townsend is a 21 y.o. female.  The history is provided by the patient.  Fever Severity:  Moderate Onset quality:  Gradual Timing:  Constant Progression:  Worsening Chronicity:  New Worsened by:  Nothing Associated symptoms: chills, cough, diarrhea, headaches, nausea and vomiting   Associated symptoms: no chest pain and no dysuria   Dizziness Associated symptoms: diarrhea, headaches, nausea, vomiting and weakness   Associated symptoms: no chest pain   Abdominal Pain Pain location:  LLQ Pain quality: aching   Pain radiates to:  Does not radiate Pain severity:  Mild Onset quality:  Gradual Associated symptoms: chills, cough, diarrhea, fever, nausea, vaginal bleeding and vomiting   Associated symptoms: no chest pain and no dysuria   Patient presents for fever, vomiting and diarrhea. She reports for over 4 days she has had fever and multiple episodes of both vomiting and diarrhea.  She is also having left lower quadrant abdominal pain.  She reports some cough.  Earlier today she stood up to go to the kitchen to get a Gatorade when she had a brief syncopal episode.  She reports hitting her head, but only has minimal headache at this time. No other injuries reported  Patient reports she went to [facility] for symptoms that she was given Zofran and another medication for headache.  She reports she had a negative COVID and flu test Past Medical History:  Diagnosis Date   ADHD (attention deficit hyperactivity disorder)    ADHD   Anxiety state 07/24/2014   Asthma    triggered by exercise and URI; prn inhaler   Chronic otitis media 05/2011    Home Medications Prior to Admission medications   Medication Sig  Start Date End Date Taking? Authorizing Provider  albuterol (PROAIR HFA) 108 (90 Base) MCG/ACT inhaler Inhale 1-2 puffs into the lungs every 6 (six) hours as needed for wheezing or shortness of breath. 08/17/20   Margaretann Loveless, PA-C  etonogestrel (NEXPLANON) 68 MG IMPL implant 1 each by Subdermal route once.    [provider]  ipratropium (ATROVENT) 0.03 % nasal spray Place 2 sprays into both nostrils every 12 (twelve) hours. 12/31/20   Margaretann Loveless, PA-C  venlafaxine XR (EFFEXOR XR) 75 MG 24 hr capsule Take 1 capsule (75 mg total) by mouth daily with breakfast. 12/19/20   Copland, Gwenlyn Found, MD      Allergies    Ciprofloxacin, Penicillins, and Soap    Review of Systems   Review of Systems  Constitutional:  Positive for chills and fever.  Respiratory:  Positive for cough.   Cardiovascular:  Negative for chest pain.  Gastrointestinal:  Positive for abdominal pain, diarrhea, nausea and vomiting.  Genitourinary:  Positive for vaginal bleeding. Negative for difficulty urinating, dysuria, frequency and urgency.       Reports currently on menstrual cycle  Neurological:  Positive for syncope, weakness and headaches.  All other systems reviewed and are negative.  Physical Exam Updated Vital Signs BP 108/78 (BP Location: Left Arm)    Pulse (!) 128    Temp (!) 101.9 F (38.8 C) (Oral)    Resp 18    Ht 1.6 m (  5\' 3" )    Wt 59 kg    LMP  (LMP Unknown) Comment: vaginal bleeding for 1 month   SpO2 98%    BMI 23.04 kg/m  Physical Exam CONSTITUTIONAL: Well developed/well nourished HEAD: Normocephalic/atraumatic, no signs of trauma EYES: EOMI/PERRL, no icterus ENMT: Mucous membranes dry NECK: supple no meningeal signs SPINE/BACK:entire spine nontender, no bruising/crepitance/stepoffs noted to spine CV: S1/S2 noted, no murmurs/rubs/gallops noted LUNGS: Lungs are clear to auscultation bilaterally, no apparent distress ABDOMEN: soft, mild LLQ tenderness, no rebound or guarding,  bowel sounds noted throughout abdomen GU:no cva tenderness NEURO: Pt is awake/alert/appropriate, moves all extremitiesx4.  No facial droop.  Patient is ambulatory EXTREMITIES: pulses normal/equal, full ROM SKIN: warm, color normal PSYCH: no abnormalities of mood noted, alert and oriented to situation  ED Results / Procedures / Treatments   Labs (all labs ordered are listed, but only abnormal results are displayed) Labs Reviewed  URINALYSIS, ROUTINE W REFLEX MICROSCOPIC - Abnormal; Notable for the following components:      Result Value   APPearance CLOUDY (*)    Hgb urine dipstick MODERATE (*)    Ketones, ur >=80 (*)    Protein, ur 30 (*)    Nitrite POSITIVE (*)    Leukocytes,Ua TRACE (*)    All other components within normal limits  BASIC METABOLIC PANEL - Abnormal; Notable for the following components:   Sodium 134 (*)    Potassium 2.8 (*)    CO2 19 (*)    Glucose, Bld 130 (*)    Calcium 8.5 (*)    All other components within normal limits  CBC WITH DIFFERENTIAL/PLATELET - Abnormal; Notable for the following components:   WBC 11.5 (*)    Neutro Abs 8.8 (*)    Monocytes Absolute 1.1 (*)    All other components within normal limits  URINALYSIS, MICROSCOPIC (REFLEX) - Abnormal; Notable for the following components:   Bacteria, UA MANY (*)    All other components within normal limits  RESP PANEL BY RT-PCR (FLU A&B, COVID) ARPGX2  URINE CULTURE  PREGNANCY, URINE    EKG EKG Interpretation  Date/Time:  Tuesday June 11 2021 23:43:38 EST Ventricular Rate:  117 PR Interval:  143 QRS Duration: 86 QT Interval:  413 QTC Calculation: 577 R Axis:   66 Text Interpretation: Sinus tachycardia Nonspecific T abnormalities, diffuse leads Prolonged QT interval Abnormal ekg Confirmed by 05-25-2004 (Zadie Rhine) on 06/11/2021 11:47:24 PM  Repeat EKG  EKG Interpretation  Date/Time:  Wednesday June 12 2021 01:38:20 EST Ventricular Rate:  89 PR Interval:  162 QRS  Duration: 85 QT Interval:  334 QTC Calculation: 407 R Axis:   62 Text Interpretation: Sinus rhythm Borderline abnrm T, anterolateral leads Baseline wander in lead(s) V2 V5 V6 Interpretation limited secondary to artifact qt no longer prolonged Confirmed by 05-02-2002 (Zadie Rhine) on 06/12/2021 1:41:42 AM         Radiology No results found.  Procedures Procedures    Medications Ordered in ED Medications  lactated ringers bolus 1,000 mL (0 mLs Intravenous Stopped 06/12/21 0106)  ondansetron (ZOFRAN) injection 4 mg (4 mg Intravenous Given 06/11/21 2353)  acetaminophen (TYLENOL) tablet 650 mg (650 mg Oral Given 06/11/21 2355)  potassium chloride SA (KLOR-CON M) CR tablet 40 mEq (40 mEq Oral Given 06/12/21 0022)  potassium chloride 10 mEq in 100 mL IVPB (0 mEq Intravenous Stopped 06/12/21 0132)  potassium chloride SA (KLOR-CON M) CR tablet 40 mEq (40 mEq Oral Given 06/12/21 0135)    ED Course/  Medical Decision Making/ A&P Clinical Course as of 06/12/21 0203  Wed Jun 12, 2021  0008 WBC(!): 11.5 Mild leukocytosis [DW]  0019 Potassium(!): 2.8 Hypokalemia noted [DW]  0020 Patient denies any dysuria or urinary frequency.  Most of her symptoms included vomiting and diarrhea.  Suspect gastroenteritis.  Urine studies suggestive of infection, but could be contaminant.  Urine culture will be sent.  Patient reports she is having heavy vaginal bleeding with menstrual cycle [DW]  0144 Patient feels improved and responding medications.  She is tolerating p.o.  Repeat EKG reveals improved heart rate and QT is no longer prolonged.  Suspect this is been exacerbated by hypokalemia.  Patient without any focal abdominal tenderness at this time, will defer CT imaging [DW]  0202 Patient feels improved.  She is comfortable with discharge home.  We discussed strict return precautions.  She already has Zofran and Phenergan at home [DW]    Clinical Course User Index [DW] Zadie Rhine, MD                            Medical Decision Making Amount and/or Complexity of Data Reviewed Labs: ordered. Decision-making details documented in ED Course. ECG/medicine tests: ordered.  Risk OTC drugs. Prescription drug management.   This patient presents to the ED for concern of fever, vomiting, diarrhea, this involves an extensive number of treatment options, and is a complaint that carries with it a high risk of complications and morbidity.  The differential diagnosis includes gastroenteritis, appendicitis, diverticulitis, urinary tract infection   Additional history obtained: Records reviewed Primary Care Documents  Lab Tests: I Ordered, and personally interpreted labs.  The pertinent results include: Hypokalemia, dehydration  Cardiac Monitoring: The patient was maintained on a cardiac monitor.  I personally viewed and interpreted the cardiac monitor which showed an underlying rhythm of:  sinus tachycardia  Medicines ordered and prescription drug management: I ordered medication including Zofran and IV fluid for nausea and dehydration Reevaluation of the patient after these medicines showed that the patient    improved  Test Considered: Since patient is rapidly improving and now has no focal abdominal tenderness, no indication for CT scan at this time  Critical Interventions:       IV fluids and IV potassium   Reevaluation: After the interventions noted above, I reevaluated the patient and found that they have :improved  Complexity of problems addressed: Patients presentation is most consistent with  acute complicated illness/injury requiring diagnostic workup      Disposition: After consideration of the diagnostic results and the patients response to treatment,  I feel that the patent would benefit from discharge .            Final Clinical Impression(s) / ED Diagnoses Final diagnoses:  Vomiting and diarrhea  Hypokalemia  Dehydration    Rx / DC Orders ED Discharge  Orders     None         Zadie Rhine, MD 06/12/21 [phone]

## 2021-06-12 ENCOUNTER — Encounter (HOSPITAL_BASED_OUTPATIENT_CLINIC_OR_DEPARTMENT_OTHER): Payer: Self-pay | Admitting: Emergency Medicine

## 2021-06-12 ENCOUNTER — Emergency Department (HOSPITAL_BASED_OUTPATIENT_CLINIC_OR_DEPARTMENT_OTHER): Payer: Medicaid Other

## 2021-06-12 ENCOUNTER — Emergency Department (HOSPITAL_BASED_OUTPATIENT_CLINIC_OR_DEPARTMENT_OTHER)
Admission: EM | Admit: 2021-06-12 | Discharge: 2021-06-13 | Disposition: A | Discharge status: Home / Self Care | Payer: Medicaid Other | Source: Home / Self Care | Attending: Emergency Medicine | Admitting: Emergency Medicine

## 2021-06-12 DIAGNOSIS — R9431 Abnormal electrocardiogram [ECG] [EKG]: Secondary | ICD-10-CM | POA: Diagnosis not present

## 2021-06-12 DIAGNOSIS — E860 Dehydration: Secondary | ICD-10-CM | POA: Insufficient documentation

## 2021-06-12 DIAGNOSIS — R197 Diarrhea, unspecified: Secondary | ICD-10-CM | POA: Insufficient documentation

## 2021-06-12 DIAGNOSIS — R112 Nausea with vomiting, unspecified: Secondary | ICD-10-CM

## 2021-06-12 DIAGNOSIS — R000 Tachycardia, unspecified: Secondary | ICD-10-CM | POA: Insufficient documentation

## 2021-06-12 DIAGNOSIS — R109 Unspecified abdominal pain: Secondary | ICD-10-CM | POA: Insufficient documentation

## 2021-06-12 DIAGNOSIS — N12 Tubulo-interstitial nephritis, not specified as acute or chronic: Secondary | ICD-10-CM | POA: Diagnosis not present

## 2021-06-12 DIAGNOSIS — E876 Hypokalemia: Secondary | ICD-10-CM | POA: Insufficient documentation

## 2021-06-12 DIAGNOSIS — N200 Calculus of kidney: Secondary | ICD-10-CM | POA: Diagnosis not present

## 2021-06-12 DIAGNOSIS — N10 Acute tubulo-interstitial nephritis: Secondary | ICD-10-CM | POA: Insufficient documentation

## 2021-06-12 LAB — URINALYSIS, ROUTINE W REFLEX MICROSCOPIC
Bilirubin Urine: NEGATIVE
Glucose, UA: NEGATIVE mg/dL
Ketones, ur: 80 mg/dL — AB
Leukocytes,Ua: NEGATIVE
Nitrite: POSITIVE — AB
Protein, ur: NEGATIVE mg/dL
Specific Gravity, Urine: 1.01 (ref 1.005–1.030)
pH: 5.5 (ref 5.0–8.0)

## 2021-06-12 LAB — CBC WITH DIFFERENTIAL/PLATELET
Abs Immature Granulocytes: 0.03 10*3/uL (ref 0.00–0.07)
Abs Immature Granulocytes: 0.05 10*3/uL (ref 0.00–0.07)
Basophils Absolute: 0 10*3/uL (ref 0.0–0.1)
Basophils Absolute: 0 10*3/uL (ref 0.0–0.1)
Basophils Relative: 0 %
Basophils Relative: 0 %
Eosinophils Absolute: 0 10*3/uL (ref 0.0–0.5)
Eosinophils Absolute: 0 10*3/uL (ref 0.0–0.5)
Eosinophils Relative: 0 %
Eosinophils Relative: 0 %
HCT: 37.4 % (ref 36.0–46.0)
HCT: 38.7 % (ref 36.0–46.0)
Hemoglobin: 12.8 g/dL (ref 12.0–15.0)
Hemoglobin: 13.2 g/dL (ref 12.0–15.0)
Immature Granulocytes: 0 %
Immature Granulocytes: 1 %
Lymphocytes Relative: 13 %
Lymphocytes Relative: 14 %
Lymphs Abs: 1.5 10*3/uL (ref 0.7–4.0)
Lymphs Abs: 1.5 10*3/uL (ref 0.7–4.0)
MCH: 29.6 pg (ref 26.0–34.0)
MCH: 29.9 pg (ref 26.0–34.0)
MCHC: 34.1 g/dL (ref 30.0–36.0)
MCHC: 34.2 g/dL (ref 30.0–36.0)
MCV: 86.6 fL (ref 80.0–100.0)
MCV: 87.6 fL (ref 80.0–100.0)
Monocytes Absolute: 1.1 10*3/uL — ABNORMAL HIGH (ref 0.1–1.0)
Monocytes Absolute: 1.1 10*3/uL — ABNORMAL HIGH (ref 0.1–1.0)
Monocytes Relative: 10 %
Monocytes Relative: 10 %
Neutro Abs: 8.3 10*3/uL — ABNORMAL HIGH (ref 1.7–7.7)
Neutro Abs: 8.8 10*3/uL — ABNORMAL HIGH (ref 1.7–7.7)
Neutrophils Relative %: 75 %
Neutrophils Relative %: 77 %
Platelets: 214 10*3/uL (ref 150–400)
Platelets: 237 10*3/uL (ref 150–400)
RBC: 4.32 MIL/uL (ref 3.87–5.11)
RBC: 4.42 MIL/uL (ref 3.87–5.11)
RDW: 11.9 % (ref 11.5–15.5)
RDW: 12.1 % (ref 11.5–15.5)
WBC: 11.1 10*3/uL — ABNORMAL HIGH (ref 4.0–10.5)
WBC: 11.5 10*3/uL — ABNORMAL HIGH (ref 4.0–10.5)
nRBC: 0 % (ref 0.0–0.2)
nRBC: 0 % (ref 0.0–0.2)

## 2021-06-12 LAB — LIPASE, BLOOD: Lipase: 27 U/L (ref 11–51)

## 2021-06-12 LAB — MAGNESIUM: Magnesium: 1.7 mg/dL (ref 1.7–2.4)

## 2021-06-12 LAB — BASIC METABOLIC PANEL
Anion gap: 12 (ref 5–15)
BUN: 6 mg/dL (ref 6–20)
CO2: 19 mmol/L — ABNORMAL LOW (ref 22–32)
Calcium: 8.5 mg/dL — ABNORMAL LOW (ref 8.9–10.3)
Chloride: 103 mmol/L (ref 98–111)
Creatinine, Ser: 0.75 mg/dL (ref 0.44–1.00)
GFR, Estimated: >60 mL/min (ref >60)
Glucose, Bld: 130 mg/dL — ABNORMAL HIGH (ref 70–99)
Potassium: 2.8 mmol/L — ABNORMAL LOW (ref 3.5–5.1)
Sodium: 134 mmol/L — ABNORMAL LOW (ref 135–145)

## 2021-06-12 LAB — COMPREHENSIVE METABOLIC PANEL
ALT: 23 U/L (ref 0–44)
AST: 21 U/L (ref 15–41)
Albumin: 3.6 g/dL (ref 3.5–5.0)
Alkaline Phosphatase: 46 U/L (ref 38–126)
Anion gap: 10 (ref 5–15)
BUN: 8 mg/dL (ref 6–20)
CO2: 20 mmol/L — ABNORMAL LOW (ref 22–32)
Calcium: 8.6 mg/dL — ABNORMAL LOW (ref 8.9–10.3)
Chloride: 106 mmol/L (ref 98–111)
Creatinine, Ser: 0.67 mg/dL (ref 0.44–1.00)
GFR, Estimated: >60 mL/min (ref >60)
Glucose, Bld: 106 mg/dL — ABNORMAL HIGH (ref 70–99)
Potassium: 3.9 mmol/L (ref 3.5–5.1)
Sodium: 136 mmol/L (ref 135–145)
Total Bilirubin: 0.9 mg/dL (ref 0.3–1.2)
Total Protein: 6.8 g/dL (ref 6.5–8.1)

## 2021-06-12 LAB — URINALYSIS, MICROSCOPIC (REFLEX)

## 2021-06-12 LAB — RESP PANEL BY RT-PCR (FLU A&B, COVID) ARPGX2
Influenza A by PCR: NEGATIVE
Influenza B by PCR: NEGATIVE
SARS Coronavirus 2 by RT PCR: NEGATIVE

## 2021-06-12 MED ORDER — ACETAMINOPHEN 325 MG PO TABS
650.0000 mg | ORAL_TABLET | Freq: Once | ORAL | Status: AC
  Administered 2021-06-12: 650 mg via ORAL
  Filled 2021-06-12: qty 2

## 2021-06-12 MED ORDER — SODIUM CHLORIDE 0.9 % IV BOLUS
1000.0000 mL | Freq: Once | INTRAVENOUS | Status: AC
  Administered 2021-06-12: 1000 mL via INTRAVENOUS

## 2021-06-12 MED ORDER — POTASSIUM CHLORIDE CRYS ER 20 MEQ PO TBCR
40.0000 meq | EXTENDED_RELEASE_TABLET | Freq: Once | ORAL | Status: AC
  Administered 2021-06-12: 40 meq via ORAL
  Filled 2021-06-12: qty 2

## 2021-06-12 MED ORDER — SODIUM CHLORIDE 0.9 % IV SOLN
1.0000 g | Freq: Once | INTRAVENOUS | Status: AC
  Administered 2021-06-12: 1 g via INTRAVENOUS
  Filled 2021-06-12: qty 10

## 2021-06-12 MED ORDER — POTASSIUM CHLORIDE 10 MEQ/100ML IV SOLN
10.0000 meq | Freq: Once | INTRAVENOUS | Status: AC
  Administered 2021-06-12: 10 meq via INTRAVENOUS
  Filled 2021-06-12: qty 100

## 2021-06-12 MED ORDER — CEPHALEXIN 500 MG PO CAPS: 500.0000 mg | ORAL_CAPSULE | Freq: Three times a day (TID) | ORAL | 0 refills | Status: DC

## 2021-06-12 MED ORDER — PROMETHAZINE HCL 25 MG PO TABS: 25.0000 mg | ORAL_TABLET | Freq: Four times a day (QID) | ORAL | 0 refills | Status: DC | PRN

## 2021-06-12 MED ORDER — ONDANSETRON HCL 4 MG/2ML IJ SOLN
4.0000 mg | Freq: Once | INTRAMUSCULAR | Status: AC
  Administered 2021-06-12: 4 mg via INTRAVENOUS
  Filled 2021-06-12: qty 2

## 2021-06-12 MED ORDER — DIPHENHYDRAMINE HCL 50 MG/ML IJ SOLN
50.0000 mg | Freq: Once | INTRAMUSCULAR | Status: AC
  Administered 2021-06-12: 50 mg via INTRAVENOUS
  Filled 2021-06-12: qty 1

## 2021-06-12 MED ORDER — IOHEXOL 300 MG/ML  SOLN
100.0000 mL | Freq: Once | INTRAMUSCULAR | Status: AC | PRN
  Administered 2021-06-12: 100 mL via INTRAVENOUS

## 2021-06-12 MED ORDER — METOCLOPRAMIDE HCL 5 MG/ML IJ SOLN
10.0000 mg | Freq: Once | INTRAMUSCULAR | Status: AC
  Administered 2021-06-12: 10 mg via INTRAVENOUS
  Filled 2021-06-12: qty 2

## 2021-06-12 NOTE — ED Provider Notes (Signed)
[facility] EMERGENCY DEPARTMENT Provider Note   CSN: [id number] Arrival date & time: 06/12/21  1949     History  Chief Complaint  Patient presents with   Back Pain   Nausea    Cindy Townsend is a 21 y.o. female here presenting with flank pain and nausea.  Patient was seen here yesterday with diarrhea and vomiting.  She was thought to have gastroenteritis.  Her urine showed bacteria but was have a contaminant.  Patient was given Zofran but had worsening vomiting and nausea.  Patient also has worsening left flank pain as well.  Patient continues to have a low-grade temperature at home.  Patient's COVID test was negative yesterday  The history is provided by the patient.      Home Medications Prior to Admission medications   Medication Sig Start Date End Date Taking? Authorizing Provider  albuterol (PROAIR HFA) 108 (90 Base) MCG/ACT inhaler Inhale 1-2 puffs into the lungs every 6 (six) hours as needed for wheezing or shortness of breath. 08/17/20   Mar Daring, PA-C  etonogestrel (NEXPLANON) 68 MG IMPL implant 1 each by Subdermal route once.    [provider]  ipratropium (ATROVENT) 0.03 % nasal spray Place 2 sprays into both nostrils every 12 (twelve) hours. 12/31/20   Mar Daring, PA-C  venlafaxine XR (EFFEXOR XR) 75 MG 24 hr capsule Take 1 capsule (75 mg total) by mouth daily with breakfast. 12/19/20   Copland, Gay Filler, MD      Allergies    Ciprofloxacin, Penicillins, and Soap    Review of Systems   Review of Systems  Genitourinary:  Positive for flank pain.  All other systems reviewed and are negative.  Physical Exam Updated Vital Signs BP (!) 117/94 (BP Location: Left Arm)    Pulse (!) 114    Temp 100.2 F (37.9 C)    Resp 20    Ht 5\' 3"  (1.6 m)    Wt 68 kg    LMP  (LMP Unknown) Comment: vaginal bleeding for 1 month   SpO2 99%    BMI 26.57 kg/m  Physical Exam Vitals and nursing note reviewed.  Constitutional:      Comments:  Uncomfortable, dehydrated  HENT:     Head: Normocephalic.     Nose: Nose normal.     Mouth/Throat:     Mouth: Mucous membranes are dry.  Eyes:     Extraocular Movements: Extraocular movements intact.     Pupils: Pupils are equal, round, and reactive to light.  Cardiovascular:     Rate and Rhythm: Regular rhythm. Tachycardia present.     Pulses: Normal pulses.  Pulmonary:     Effort: Pulmonary effort is normal.     Breath sounds: Normal breath sounds.  Abdominal:     General: Abdomen is flat.     Palpations: Abdomen is soft.     Comments: + L CVAT   Musculoskeletal:        General: Normal range of motion.     Cervical back: Normal range of motion and neck supple.  Skin:    General: Skin is warm.  Neurological:     General: No focal deficit present.    ED Results / Procedures / Treatments   Labs (all labs ordered are listed, but only abnormal results are displayed) Labs Reviewed  CBC WITH DIFFERENTIAL/PLATELET  COMPREHENSIVE METABOLIC PANEL  LIPASE, BLOOD  URINALYSIS, ROUTINE W REFLEX MICROSCOPIC  MAGNESIUM    EKG EKG Interpretation  Date/Time:  Wednesday June 12 2021 21:40:53 EST Ventricular Rate:  95 PR Interval:  175 QRS Duration: 83 QT Interval:  327 QTC Calculation: 411 R Axis:   49 Text Interpretation: Sinus rhythm Borderline T wave abnormalities No significant change since last tracing Confirmed by Wandra Arthurs [phone]) on 06/12/2021 9:43:05 PM  Radiology No results found.  Procedures Procedures    Medications Ordered in ED Medications  cefTRIAXone (ROCEPHIN) 1 g in sodium chloride 0.9 % 100 mL IVPB (1 g Intravenous New Bag/Given 06/12/21 2139)  iohexol (OMNIPAQUE) 300 MG/ML solution 100 mL (has no administration in time range)  sodium chloride 0.9 % bolus 1,000 mL (1,000 mLs Intravenous New Bag/Given 06/12/21 2133)  ondansetron (ZOFRAN) injection 4 mg (4 mg Intravenous Given 06/12/21 2135)    ED Course/ Medical Decision Making/ A&P                            Medical Decision Making Cindy Townsend is a 21 y.o. female who presented with left flank pain.  Patient has low-grade temperature and nausea vomiting since yesterday.  Patient's potassium is low and was supplemented.  Patient has persistent flank pain and nausea. At this point I am concerned for possible pyelonephritis versus gastroenteritis.We will get CT abdomen pelvis to further assess.  We will repeat CBC and CMP and get a magnesium level.  We will also repeat a urinalysis   11:23 PM I reviewed her labs and imaging.  Patient's white blood cell count is 11.  Patient's potassium is normal now and magnesium is normal.  Patient felt better after Zofran and Reglan.  Patient CT showed bilateral pyelonephritis.  However her temperature has improved from yesterday and is only low-grade 100.2.  She is able to tolerate p.o. now.  She received Rocephin in the ED.  I offered her admission for observation versus close follow-up with outpatient biotics.  Patient rather go home and felt better.  At this point will prescribe nausea medicine and also Keflex.  I gave strict return precautions.  Problems Addressed: Nausea and vomiting, unspecified vomiting type: acute illness or injury Pyelonephritis: acute illness or injury  Amount and/or Complexity of Data Reviewed External Data Reviewed: labs, radiology and notes. Labs: ordered. Decision-making details documented in ED Course. Radiology: ordered and independent interpretation performed. Decision-making details documented in ED Course. ECG/medicine tests: ordered and independent interpretation performed. Decision-making details documented in ED Course.  Risk OTC drugs. Prescription drug management.  Final Clinical Impression(s) / ED Diagnoses Final diagnoses:  None    Rx / DC Orders ED Discharge Orders     None         Drenda Freeze, MD 06/12/21 2339

## 2021-06-12 NOTE — ED Notes (Signed)
Pt ambulatory to restroom with independent steady gait °

## 2021-06-12 NOTE — ED Notes (Signed)
Pt A&Ox4 ambulatory at d/c with independent steady gait, NAD. Pt verbalized understanding of d/c instructions and follow up care. °

## 2021-06-12 NOTE — ED Notes (Signed)
Pt given gatorade for fluid challenge. 

## 2021-06-12 NOTE — ED Notes (Signed)
Pt reports she is still nauseous. Dr. Silverio Lay informed.

## 2021-06-12 NOTE — Discharge Instructions (Signed)
You have a kidney infection.  Please take Keflex 3 times a day for a week for kidney infection  Please stay hydrated.  You can try Zofran as prescribed yesterday.  If it does not help you can try Phenergan for nausea  See your doctor for follow-up  Return to ER if you have worse flank pain, vomiting, fever

## 2021-06-12 NOTE — ED Notes (Signed)
Pt informed a urine sample is needed and she stated, "I am too dehydrated right now."

## 2021-06-12 NOTE — ED Triage Notes (Signed)
Pt seen here yesterday; sts dx with UTI; feels worse now with pain in back and "lightheaded"; +nausea

## 2021-06-13 NOTE — ED Notes (Signed)
Pt reports she feels better. Pt A&OX4 ambulatory at d/c with independent steady gait. Pt verbalized understanding of d/c instructions, prescriptions and follow up care.

## 2021-06-14 ENCOUNTER — Telehealth: Payer: Self-pay | Admitting: *Deleted

## 2021-06-14 LAB — URINE CULTURE: Culture: >=100000 — AB

## 2021-06-14 NOTE — Telephone Encounter (Signed)
Transition Care Management Follow-up Telephone Call Date of discharge and from where: 06/13/2021 - [facility] How have you been since you were released from the hospital? "I am okay" Any questions or concerns? No  Items Reviewed: Did the pt receive and understand the discharge instructions provided? Yes  Medications obtained and verified?  N/A Other? No  Any new allergies since your discharge? No  Dietary orders reviewed? No Do you have support at home? Yes    Functional Questionnaire: (I = Independent and D = Dependent) ADLs: I  Bathing/Dressing- I  Meal Prep- I  Eating- I  Maintaining continence- I  Transferring/Ambulation- I  Managing Meds- I  Follow up appointments reviewed:  PCP Hospital f/u appt confirmed? No   Specialist Hospital f/u appt confirmed? No   Are transportation arrangements needed? No  If their condition worsens, is the pt aware to call PCP or go to the Emergency Dept.? Yes Was the patient provided with contact information for the PCP's office or ED? Yes Was to pt encouraged to call back with questions or concerns? Yes

## 2021-06-15 ENCOUNTER — Telehealth (HOSPITAL_BASED_OUTPATIENT_CLINIC_OR_DEPARTMENT_OTHER): Payer: Self-pay | Admitting: *Deleted

## 2021-06-15 LAB — URINE CULTURE: Culture: 20000 — AB

## 2021-06-15 NOTE — Telephone Encounter (Signed)
Post ED Visit - Positive Culture Follow-up  Culture report reviewed by antimicrobial stewardship pharmacist: [facility] []  , Pharm.D. []  Enzo Bi, .D., BCPS AQ-ID []  Celedonio Miyamoto, Pharm.D., BCPS []  [address], .D., BCPS []  [place], .D., BCPS, AAHIVP []  Georgina Pillion, Pharm.D., BCPS, AAHIVP []  [address], PharmD, BCPS []  , PharmD, BCPS []  [place], PharmD, BCPS []  [address], PharmD []  , PharmD, BCPS [x]  Estella Husk, PharmD  Pharmacy Team []  Lysle Pearl, PharmD []  , PharmD []  Phillips Climes, PharmD []  , Rph []  Agapito Games) , PharmD []  Verlan Friends, PharmD []  , PharmD []  Mervyn Gay, PharmD []  , PharmD []  Vinnie Level, PharmD []  [facility], PharmD []  , PharmD []  Len Childs, PharmD   Positive urine culture Treated with Cephalexin, organism sensitive to the same and no further patient follow-up is required at this time.  06/15/2021, 1:18 PM

## 2021-06-16 NOTE — Telephone Encounter (Signed)
Post ED Visit - Positive Culture Follow-up  Culture report reviewed by antimicrobial stewardship pharmacist: [facility] []  , Pharm.D. []  Enzo Bi, Pharm.D., BCPS AQ-ID []  , Pharm.D., BCPS []  Celedonio Miyamoto, .D., BCPS []  [place], .D., BCPS, AAHIVP []  Georgina Pillion, Pharm.D., BCPS, AAHIVP []  [address], PharmD, BCPS []  , PharmD, BCPS []  [place], PharmD, BCPS []  [address], PharmD []  , PharmD, BCPS [x]  Estella Husk, PharmD  Pharmacy Team []  Lysle Pearl, PharmD []  , PharmD []  Phillips Climes, PharmD []  , Rph []  Agapito Games) , PharmD []  Verlan Friends, PharmD []  , PharmD []  Mervyn Gay, PharmD []  , PharmD []  Vinnie Level, PharmD []  [facility], PharmD []  , PharmD []  Len Childs, PharmD   Positive urine culture Treated with Cephalexin, organism sensitive to the same and no further patient follow-up is required at this time.  06/16/2021, 12:19 PM

## 2021-06-17 ENCOUNTER — Telehealth: Payer: Self-pay

## 2021-06-17 NOTE — Patient Outreach (Signed)
Care Coordination  06/17/2021  SHAWNTEL CANCHE 2000/06/18 [id number]  Patient stated she was at work and she got off at 4:00. BSW and patient agreed BSW would call back at 4:30.   Mickel Fuchs, BSW, [place] Managed Medicaid Team  [phone]

## 2021-06-17 NOTE — Patient Outreach (Signed)
Care Coordination  06/17/2021  Cindy Townsend 11-17-2000 [id number]   Medicaid Managed Care   Unsuccessful Outreach Note  06/17/2021 Name: Cindy Townsend MRN: [id number] DOB: [birth year]  Referred by: Bradd Canary, MD Reason for referral : High Risk Managed Medicaid (MM social work telephone outreach)   An unsuccessful telephone outreach was attempted today. The patient was referred to the case management team for assistance with care management and care coordination.   Follow Up Plan: The care management team will reach out to the patient again over the next 7 days.   Marland Kitchenajs

## 2021-06-26 ENCOUNTER — Emergency Department (HOSPITAL_BASED_OUTPATIENT_CLINIC_OR_DEPARTMENT_OTHER)
Admission: EM | Admit: 2021-06-26 | Discharge: 2021-06-27 | Disposition: A | Discharge status: Home / Self Care | Payer: Medicaid Other | Attending: Emergency Medicine | Admitting: Emergency Medicine

## 2021-06-26 ENCOUNTER — Ambulatory Visit (INDEPENDENT_AMBULATORY_CARE_PROVIDER_SITE_OTHER): Payer: Medicaid Other | Admitting: Family

## 2021-06-26 ENCOUNTER — Encounter (HOSPITAL_BASED_OUTPATIENT_CLINIC_OR_DEPARTMENT_OTHER): Payer: Self-pay | Admitting: Urology

## 2021-06-26 ENCOUNTER — Emergency Department (HOSPITAL_BASED_OUTPATIENT_CLINIC_OR_DEPARTMENT_OTHER): Payer: Medicaid Other

## 2021-06-26 ENCOUNTER — Other Ambulatory Visit: Payer: Self-pay

## 2021-06-26 VITALS — BP 113/62 | HR 90 | Temp 98.4°F | Resp 16 | Wt 185.2 lb

## 2021-06-26 DIAGNOSIS — J45909 Unspecified asthma, uncomplicated: Secondary | ICD-10-CM | POA: Diagnosis not present

## 2021-06-26 DIAGNOSIS — R1031 Right lower quadrant pain: Secondary | ICD-10-CM | POA: Diagnosis not present

## 2021-06-26 DIAGNOSIS — N200 Calculus of kidney: Secondary | ICD-10-CM | POA: Diagnosis not present

## 2021-06-26 DIAGNOSIS — K820 Obstruction of gallbladder: Secondary | ICD-10-CM | POA: Diagnosis not present

## 2021-06-26 DIAGNOSIS — R109 Unspecified abdominal pain: Secondary | ICD-10-CM | POA: Insufficient documentation

## 2021-06-26 DIAGNOSIS — N12 Tubulo-interstitial nephritis, not specified as acute or chronic: Secondary | ICD-10-CM

## 2021-06-26 DIAGNOSIS — R110 Nausea: Secondary | ICD-10-CM | POA: Diagnosis not present

## 2021-06-26 HISTORY — DX: Tubulo-interstitial nephritis, not specified as acute or chronic: N12

## 2021-06-26 LAB — PREGNANCY, URINE: Preg Test, Ur: NEGATIVE

## 2021-06-26 LAB — CBC WITH DIFFERENTIAL/PLATELET
Basophils Absolute: 0 10*3/uL (ref 0.0–0.1)
Basophils Relative: 0.6 % (ref 0.0–3.0)
Eosinophils Absolute: 0.1 10*3/uL (ref 0.0–0.7)
Eosinophils Relative: 1.1 % (ref 0.0–5.0)
HCT: 40.4 % (ref 36.0–46.0)
Hemoglobin: 13.2 g/dL (ref 12.0–15.0)
Lymphocytes Relative: 35.1 % (ref 12.0–46.0)
Lymphs Abs: 1.8 10*3/uL (ref 0.7–4.0)
MCHC: 32.7 g/dL (ref 30.0–36.0)
MCV: 89.2 fl (ref 78.0–100.0)
Monocytes Absolute: 0.3 10*3/uL (ref 0.1–1.0)
Monocytes Relative: 5.7 % (ref 3.0–12.0)
Neutro Abs: 3 10*3/uL (ref 1.4–7.7)
Neutrophils Relative %: 57.5 % (ref 43.0–77.0)
Platelets: 433 10*3/uL — ABNORMAL HIGH (ref 150.0–400.0)
RBC: 4.53 Mil/uL (ref 3.87–5.11)
RDW: 13.5 % (ref 11.5–14.6)
WBC: 5.2 10*3/uL (ref 4.5–10.5)

## 2021-06-26 LAB — URINALYSIS, MICROSCOPIC (REFLEX)

## 2021-06-26 LAB — BASIC METABOLIC PANEL
BUN: 10 mg/dL (ref 6–23)
CO2: 26 mEq/L (ref 19–32)
Calcium: 9.4 mg/dL (ref 8.4–10.5)
Chloride: 105 mEq/L (ref 96–112)
Creatinine, Ser: 0.67 mg/dL (ref 0.40–1.20)
GFR: 125.87 mL/min (ref >60.00)
Glucose, Bld: 88 mg/dL (ref 70–99)
Potassium: 4.3 mEq/L (ref 3.5–5.1)
Sodium: 138 mEq/L (ref 135–145)

## 2021-06-26 LAB — POC URINALSYSI DIPSTICK (AUTOMATED)
Bilirubin, UA: NEGATIVE
Glucose, UA: NEGATIVE
Ketones, UA: NEGATIVE
Leukocytes, UA: NEGATIVE
Nitrite, UA: NEGATIVE
Protein, UA: NEGATIVE
Spec Grav, UA: 1.02 (ref 1.010–1.025)
Urobilinogen, UA: 0.2 E.U./dL
pH, UA: 5 (ref 5.0–8.0)

## 2021-06-26 LAB — URINALYSIS, ROUTINE W REFLEX MICROSCOPIC
Bilirubin Urine: NEGATIVE
Glucose, UA: NEGATIVE mg/dL
Ketones, ur: NEGATIVE mg/dL
Leukocytes,Ua: NEGATIVE
Nitrite: NEGATIVE
Protein, ur: NEGATIVE mg/dL
Specific Gravity, Urine: 1.02 (ref 1.005–1.030)
pH: 6 (ref 5.0–8.0)

## 2021-06-26 MED ORDER — CEPHALEXIN 500 MG PO CAPS: 500.0000 mg | ORAL_CAPSULE | Freq: Four times a day (QID) | ORAL | 0 refills | Status: DC

## 2021-06-26 MED ORDER — CEFTRIAXONE SODIUM 1 G IJ SOLR
1.0000 g | Freq: Once | INTRAMUSCULAR | Status: AC
  Administered 2021-06-26: 1 g via INTRAMUSCULAR

## 2021-06-26 NOTE — ED Provider Notes (Signed)
[facility] Provider Note: Georgena Spurling, MD, FACEP  CSN: [id number] MRN: [id number] ARRIVAL: 06/26/21 at 2152 ROOM: [place]  Flank Pain   HISTORY OF PRESENT ILLNESS  06/26/21 11:24 PM Cindy Townsend is a 21 y.o. female who was treated for right pyelonephritis on 06/12/2021 with Keflex.  Her symptoms had been urinary urgency, urinary frequency, burning with urination and voiding small amounts.  Her culture grew out E. coli pansensitive except to sulfa.  Despite treatment she persisted with right flank pain and difficulty urinating and was seen by her PCP in the office this morning.  A urine dipstick was negative but she was given a shot of Rocephin IM and restarted on Keflex for suspected persistent pyelonephritis.  She has continued to have right flank pain, radiating to her right lower quadrant, which has come and gone and has been severe at times hence she came to the ED.  She is pain-free presently.  She has had nausea with the pain but no vomiting.   Past Medical History:  Diagnosis Date   ADHD (attention deficit hyperactivity disorder)    ADHD   Anxiety state 07/24/2014   Asthma    triggered by exercise and URI; prn inhaler   Chronic otitis media 05/2011    Past Surgical History:  Procedure Laterality Date   TONSILLECTOMY     TYMPANOSTOMY TUBE PLACEMENT  2012    Family History  Problem Relation Age of Onset   Asthma Mother    Anesthesia problems Mother        post-op nausea   Hypertension Maternal Grandmother    Asthma Maternal Grandmother    Diabetes Paternal Grandmother        type 2- controlled by diet    Social History   Tobacco Use   Smoking status: Never    Passive exposure: Never   Smokeless tobacco: Never  Vaping Use   Vaping Use: Never used  Substance Use Topics   Alcohol use: No   Drug use: No    Prior to Admission medications   Medication Sig Start Date End Date Taking? Authorizing Provider  albuterol  (PROAIR HFA) 108 (90 Base) MCG/ACT inhaler Inhale 1-2 puffs into the lungs every 6 (six) hours as needed for wheezing or shortness of breath. 08/17/20   [place]ette, Clearnce Sorrel, PA-C  cephALEXin (KEFLEX) 500 MG capsule Take 1 capsule (500 mg total) by mouth 4 (four) times daily. 06/26/21   Debbrah Alar, NP  etonogestrel (NEXPLANON) 68 MG IMPL implant 1 each by Subdermal route once.    [provider]  promethazine (PHENERGAN) 25 MG tablet Take 1 tablet (25 mg total) by mouth every 6 (six) hours as needed for nausea or vomiting. 06/12/21   Drenda Freeze, MD  venlafaxine XR (EFFEXOR XR) 75 MG 24 hr capsule Take 1 capsule (75 mg total) by mouth daily with breakfast. 12/19/20   Copland, Gay Filler, MD    Allergies Ciprofloxacin, Penicillins, and Soap   REVIEW OF SYSTEMS  Negative except as noted here or in the History of Present Illness.   PHYSICAL EXAMINATION  Initial Vital Signs Blood pressure 125/72, pulse 98, temperature 98.4 F (36.9 C), temperature source Oral, resp. rate 18, height 5\' 3"  (1.6 m), weight 84 kg, SpO2 99 %.  Examination General: Well-developed, well-nourished female in no acute distress; appearance consistent with age of record HENT: normocephalic; atraumatic Eyes: normal appearance Neck: supple Heart: regular rate and rhythm Lungs: clear to auscultation bilaterally  Abdomen: soft; nondistended; nontender; bowel sounds present GU: Right CVA tenderness Extremities: No deformity; full range of motion; pulses normal Neurologic: Awake, alert and oriented; motor function intact in all extremities and symmetric; no facial droop Skin: Warm and dry Psychiatric: Normal mood and affect   RESULTS  Summary of this visit's results, reviewed and interpreted by myself:   EKG Interpretation  Date/Time:    Ventricular Rate:    PR Interval:    QRS Duration:   QT Interval:    QTC Calculation:   R Axis:     Text Interpretation:         Laboratory  Studies: Results for orders placed or performed during the hospital encounter of 06/26/21 (from the past 24 hour(s))  Urinalysis, Routine w reflex microscopic Urine, Clean Catch     Status: Abnormal   Collection Time: 06/26/21 10:02 PM  Result Value Ref Range   Color, Urine YELLOW YELLOW   APPearance CLEAR CLEAR   Specific Gravity, Urine 1.020 1.005 - 1.030   pH 6.0 5.0 - 8.0   Glucose, UA NEGATIVE NEGATIVE mg/dL   Hgb urine dipstick TRACE (A) NEGATIVE   Bilirubin Urine NEGATIVE NEGATIVE   Ketones, ur NEGATIVE NEGATIVE mg/dL   Protein, ur NEGATIVE NEGATIVE mg/dL   Nitrite NEGATIVE NEGATIVE   Leukocytes,Ua NEGATIVE NEGATIVE  Urinalysis, Microscopic (reflex)     Status: Abnormal   Collection Time: 06/26/21 10:02 PM  Result Value Ref Range   RBC / HPF 0-5 0 - 5 RBC/hpf   WBC, UA 0-5 0 - 5 WBC/hpf   Bacteria, UA FEW (A) NONE SEEN   Squamous Epithelial / LPF 0-5 0 - 5  Pregnancy, urine     Status: None   Collection Time: 06/26/21 11:25 PM  Result Value Ref Range   Preg Test, Ur NEGATIVE NEGATIVE   Imaging Studies: CT Renal Stone Study  Result Date: 06/26/2021 CLINICAL DATA:  Flank pain, kidney stone suspected EXAM: CT ABDOMEN AND PELVIS WITHOUT CONTRAST TECHNIQUE: Multidetector CT imaging of the abdomen and pelvis was performed following the standard protocol without IV contrast. RADIATION DOSE REDUCTION: This exam was performed according to the departmental dose-optimization program which includes automated exposure control, adjustment of the mA and/or kV according to patient size and/or use of iterative reconstruction technique. COMPARISON:  CT abdomen pelvis 06/12/2021 FINDINGS: Lower chest: No acute abnormality. Hepatobiliary: No focal liver abnormality. Contracted gallbladder. No gallstones, gallbladder wall thickening, or pericholecystic fluid. No biliary dilatation. Pancreas: No focal lesion. Normal pancreatic contour. No surrounding inflammatory changes. No main pancreatic ductal  dilatation. Spleen: Normal in size without focal abnormality. Adrenals/Urinary Tract: No adrenal nodule bilaterally. Punctate bilateral nephrolithiasis. No hydronephrosis. No definite contour-deforming renal mass. No ureterolithiasis or hydroureter. The urinary bladder is unremarkable. Stomach/Bowel: Stomach is within normal limits. No evidence of bowel wall thickening or dilatation. Stool noted within the ascending and transverse colon. Appendix appears normal. Vascular/Lymphatic: No abdominal aorta or iliac aneurysm. No abdominal, pelvic, or inguinal lymphadenopathy. Reproductive: Uterus and bilateral adnexa are unremarkable. Other: No intraperitoneal free fluid. No intraperitoneal free gas. No organized fluid collection. Musculoskeletal: No abdominal wall hernia or abnormality. No suspicious lytic or blastic osseous lesions. No acute displaced fracture. IMPRESSION: 1. Punctate nonobstructive bilateral nephrolithiasis. 2. Otherwise no acute intra-abdominal or intrapelvic abnormality with limited evaluation on this noncontrast study. Electronically Signed   By: Iven Finn M.D.   On: 06/26/2021 23:51    ED COURSE and MDM  Nursing notes, initial and subsequent vitals signs, including pulse oximetry,  reviewed and interpreted by myself.  Vitals:   06/26/21 2158 06/26/21 2158 06/26/21 2159  BP:  125/72   Pulse:  98   Resp:  18   Temp:  98.4 F (36.9 C)   TempSrc:  Oral   SpO2: 99% 99%   Weight:   84 kg  Height:   5\' 3"  (1.6 m)   Medications - No data to display  11:49 PM Patient's urinalysis is unremarkable except for trace hemoglobin.  The persistent flank pain is concerning for ureterolithiasis and we will obtain a CT scan.  12:07 AM No ureterolithiasis seen on CT scan.  There are punctate stones in the kidney suggesting she may have recently passed a stone.  This may have been the nidus of infection which appears to have resolved or is subclinical at this point.  She was advised that she  should continue taking the antibiotic as she may have a subclinical residual kidney infection.  Urine was cultured yesterday in the clinic and is pending.  She was advised to take over-the-counter NSAIDs as needed for pain.  PROCEDURES  Procedures   ED DIAGNOSES     ICD-10-CM   1. Right flank pain  R10.9          Adylin Hankey, Jenny Reichmann, MD 06/27/21 [phone]

## 2021-06-26 NOTE — ED Notes (Signed)
Patient transported to CT 

## 2021-06-26 NOTE — ED Triage Notes (Signed)
UTI last week, dx with kidney infection  ?C/o lower back pain and groin pain  ?Finished antibiotics  ?States continued dysuria and pain  ?

## 2021-06-26 NOTE — Progress Notes (Signed)
? ?Subjective:  ? ?By signing my name below, I, Shehryar Baig, attest that this documentation has been prepared under the direction and in the presence of Debbrah Alar, NP. 06/26/2021 ? ? ? Patient ID: Cindy Townsend, female    DOB: [birth year], 21 y.o.   MRN: [id number] ? ?Chief Complaint  ?Patient presents with  ? Pelvic Pain  ?  Complains of pelvic/ suprapubic pain  ? Back Pain  ?  Complains of low back pain  ? Urinary Tract Infection  ?  Has had 3 UTI's since October  ? ? ?HPI ?Patient is in today for a office visit.  ? ?She currently experiencing left flank pain, burning while urinating and foul smelling urine. She had similar symptoms since October, 2022. She seen another provider and was prescribed medication to manage her symptoms and found relief. Her symptoms returned shortly after and she also developed sharp pain with urination. She returned to the ED 15 days ago when her pain worsened and was diagnosed with pyelonephritis. She was given IM Rocephin followed by PO keflex, however she did not note significant improvement in her symptoms despite finishing her course. She is not sexually active at this time but was in the past. She had no sexual activity since testing negative for GC/Chlamydia in November.  She reports that she has had a fever as high as 101.2. She has not seen a urologist to manage her symptoms but is interested in setting up an appointment.  ? ? ?Health Maintenance Due  ?Topic Date Due  ? COVID-19 Vaccine (1) Never done  ? HPV VACCINES (1 - 2-dose series) Never done  ? Hepatitis C Screening  Never done  ? INFLUENZA VACCINE  11/26/2020  ? ? ?Past Medical History:  ?Diagnosis Date  ? ADHD (attention deficit hyperactivity disorder)   ? ADHD  ? Anxiety state 07/24/2014  ? Asthma   ? triggered by exercise and URI; prn inhaler  ? Chronic otitis media 05/2011  ? ? ?Past Surgical History:  ?Procedure Laterality Date  ? TONSILLECTOMY    ? TYMPANOSTOMY TUBE PLACEMENT  2012  ? ? ?Family  History  ?Problem Relation Age of Onset  ? Asthma Mother   ? Anesthesia problems Mother   ?     post-op nausea  ? Hypertension Maternal Grandmother   ? Asthma Maternal Grandmother   ? Diabetes Paternal Grandmother   ?     type 2- controlled by diet  ? ? ?Social History  ? ?Socioeconomic History  ? Marital status: Single  ?  Spouse name: Not on file  ? Number of children: Not on file  ? Years of education: Not on file  ? Highest education level: Not on file  ?Occupational History  ? Not on file  ?Tobacco Use  ? Smoking status: Never  ?  Passive exposure: Never  ? Smokeless tobacco: Never  ?Vaping Use  ? Vaping Use: Never used  ?Substance and Sexual Activity  ? Alcohol use: No  ? Drug use: No  ? Sexual activity: Yes  ?  Birth control/protection: None  ?Other Topics Concern  ? Not on file  ?Social History Narrative  ? Not on file  ? ?Social Determinants of Health  ? ?Financial Resource Strain: Not on file  ?Food Insecurity: Not on file  ?Transportation Needs: Not on file  ?Physical Activity: Not on file  ?Stress: Not on file  ?Social Connections: Not on file  ?Intimate Partner Violence: Not on file  ? ? ?  Outpatient Medications Prior to Visit  ?Medication Sig Dispense Refill  ? albuterol (PROAIR HFA) 108 (90 Base) MCG/ACT inhaler Inhale 1-2 puffs into the lungs every 6 (six) hours as needed for wheezing or shortness of breath. 8 g 0  ? etonogestrel (NEXPLANON) 68 MG IMPL implant 1 each by Subdermal route once.    ? promethazine (PHENERGAN) 25 MG tablet Take 1 tablet (25 mg total) by mouth every 6 (six) hours as needed for nausea or vomiting. 10 tablet 0  ? venlafaxine XR (EFFEXOR XR) 75 MG 24 hr capsule Take 1 capsule (75 mg total) by mouth daily with breakfast. 30 capsule 3  ? cephALEXin (KEFLEX) 500 MG capsule Take 1 capsule (500 mg total) by mouth 3 (three) times daily. 21 capsule 0  ? ipratropium (ATROVENT) 0.03 % nasal spray Place 2 sprays into both nostrils every 12 (twelve) hours. 30 mL 12  ? ?No  facility-administered medications prior to visit.  ? ? ?Allergies  ?Allergen Reactions  ? Ciprofloxacin Itching  ? Penicillins Hives  ?  Has patient had a PCN reaction causing immediate rash, facial/tongue/throat swelling, SOB or lightheadedness with hypotension: yes ?Has patient had a PCN reaction causing severe rash involving mucus membranes or skin necrosis: no ?Has patient had a PCN reaction that required hospitalization no ?Has patient had a PCN reaction occurring within the last 10 years: yes ?If all of the above answers are "NO", then may proceed with Cephalosporin use.  ? Soap Rash  ?  Has to use sensitive skin products  ? ? ?Review of Systems  ?Genitourinary:  Positive for dysuria and flank pain (left).  ?     (+)strong odor urine  ? ?   ?Objective:  ?  ?Physical Exam ?Constitutional:   ?   General: She is not in acute distress. ?   Appearance: Normal appearance. She is not ill-appearing.  ?HENT:  ?   Head: Normocephalic and atraumatic.  ?   Right Ear: External ear normal.  ?   Left Ear: External ear normal.  ?Eyes:  ?   Extraocular Movements: Extraocular movements intact.  ?   Pupils: Pupils are equal, round, and reactive to light.  ?Cardiovascular:  ?   Rate and Rhythm: Normal rate and regular rhythm.  ?   Heart sounds: Normal heart sounds. No murmur heard. ?  No gallop.  ?Pulmonary:  ?   Effort: Pulmonary effort is normal. No respiratory distress.  ?   Breath sounds: Normal breath sounds. No wheezing or rales.  ?Abdominal:  ?   Tenderness: There is abdominal tenderness in the suprapubic area. There is left CVA tenderness.  ?Skin: ?   General: Skin is warm and dry.  ?Neurological:  ?   Mental Status: She is alert and oriented to person, place, and time.  ?Psychiatric:     ?   Behavior: Behavior normal.  ? ? ?BP 113/62 (BP Location: Left Arm, Patient Position: Sitting, Cuff Size: Small)   Pulse 90   Temp 98.4 ?F (36.9 ?C) (Oral)   Resp 16   Wt 185 lb 3.2 oz (84 kg)   LMP  (LMP Unknown) Comment:  vaginal bleeding for 1 month  SpO2 99%   BMI 32.81 kg/m?  ?Wt Readings from Last 3 Encounters:  ?06/26/21 185 lb 3.2 oz (84 kg)  ?06/12/21 150 lb (68 kg)  ?06/11/21 130 lb 1.1 oz (59 kg)  ? ? ?   ?Assessment & Plan:  ? ?Problem List Items Addressed This Visit   ? ?  ?  Unprioritized  ? Pyelonephritis - Primary  ?  UA notes trace blood.  Will send for urine culture. Sounds like it was incompletely treated.  She is allergic to penicillin and sulfa drugs which makes treatment options outpatient more limited. Will retreat with keflex (but QID rather than TID) x 10 days. Rocephin 1 gm IM to be given today in the clinic. Due to recurrent nature of her UTI's will refer to Urology for further evaluation.  She is advised to go to the ER if fever >101, worsening pain. Otherwise we will plan to see her back in the office early next week for evaluation.  ?  ?  ? Relevant Medications  ? cephALEXin (KEFLEX) 500 MG capsule  ? Other Relevant Orders  ? CBC with Differential/Platelet  ? Basic metabolic panel  ? Ambulatory referral to Urology  ? ? ? ?Meds ordered this encounter  ?Medications  ? cephALEXin (KEFLEX) 500 MG capsule  ?  Sig: Take 1 capsule (500 mg total) by mouth 4 (four) times daily.  ?  Dispense:  40 capsule  ?  Refill:  0  ?  Order Specific Question:   Supervising Provider  ?  Answer:   Penni Homans A [id number]  ? ? ?I, Nance Pear, NP, personally preformed the services described in this documentation.  All medical record entries made by the scribe were at my direction and in my presence.  I have reviewed the chart and discharge instructions (if applicable) and agree that the record reflects my personal performance and is accurate and complete. 06/26/2021 ? ? ?Engineering geologist as a Education administrator for [employer], NP.,have documented all relevant documentation on the behalf of Nance Pear, NP,as directed by  Nance Pear, NP while in the presence of Nance Pear, NP. ? ? ?Nance Pear, NP ? ?

## 2021-06-26 NOTE — Assessment & Plan Note (Signed)
UA notes trace blood.  Will send for urine culture. Sounds like it was incompletely treated.  She is allergic to penicillin and sulfa drugs which makes treatment options outpatient more limited. Will retreat with keflex (but QID rather than TID) x 10 days. Rocephin 1 gm IM to be given today in the clinic. Due to recurrent nature of her UTI's will refer to Urology for further evaluation.  She is advised to go to the ER if fever >101, worsening pain. Otherwise we will plan to see her back in the office early next week for evaluation.  ?

## 2021-06-27 ENCOUNTER — Encounter: Payer: Self-pay | Admitting: Family

## 2021-06-28 ENCOUNTER — Telehealth: Payer: Self-pay

## 2021-06-28 LAB — URINE CULTURE
MICRO NUMBER:: 13073780
SPECIMEN QUALITY:: ADEQUATE

## 2021-06-28 NOTE — Telephone Encounter (Signed)
Transition Care Management Unsuccessful Follow-up Telephone Call ? ?Date of discharge and from where:  06/27/2021 from [facility] ? ?Attempts:  1st Attempt ? ?Reason for unsuccessful TCM follow-up call:  No answer/busy ? ? ? ?

## 2021-07-01 ENCOUNTER — Ambulatory Visit: Payer: Medicaid Other | Admitting: Family

## 2021-07-01 NOTE — Progress Notes (Incomplete)
? ?Subjective:  ? ?By signing my name below, I, Mateo Flow, attest that this documentation has been prepared under the direction and in the presence of Sandford Craze, NP 07/01/2021   ?  ? ? Patient ID: Cindy Townsend, female    DOB: 2001/03/22, 21 y.o.   MRN: [id number] ? ?No chief complaint on file. ? ? ?HPI ?Patient is in today for an office visit and 5 day f/u ? ?Pyelonephritis-  She was seen in the ED on 03/01 after for her office visit for worsening right flank pain. The pain had resolved after some time in ED and she was discharged on 03/02. ? ?Past Medical History:  ?Diagnosis Date  ? ADHD (attention deficit hyperactivity disorder)   ? ADHD  ? Anxiety state 07/24/2014  ? Asthma   ? triggered by exercise and URI; prn inhaler  ? Chronic otitis media 05/2011  ? ? ?Past Surgical History:  ?Procedure Laterality Date  ? TONSILLECTOMY    ? TYMPANOSTOMY TUBE PLACEMENT  2012  ? ? ?Family History  ?Problem Relation Age of Onset  ? Asthma Mother   ? Anesthesia problems Mother   ?     post-op nausea  ? Hypertension Maternal Grandmother   ? Asthma Maternal Grandmother   ? Diabetes Paternal Grandmother   ?     type 2- controlled by diet  ? ? ?Social History  ? ?Socioeconomic History  ? Marital status: Single  ?  Spouse name: Not on file  ? Number of children: Not on file  ? Years of education: Not on file  ? Highest education level: Not on file  ?Occupational History  ? Not on file  ?Tobacco Use  ? Smoking status: Never  ?  Passive exposure: Never  ? Smokeless tobacco: Never  ?Vaping Use  ? Vaping Use: Never used  ?Substance and Sexual Activity  ? Alcohol use: No  ? Drug use: No  ? Sexual activity: Yes  ?  Birth control/protection: None  ?Other Topics Concern  ? Not on file  ?Social History Narrative  ? Not on file  ? ?Social Determinants of Health  ? ?Financial Resource Strain: Not on file  ?Food Insecurity: Not on file  ?Transportation Needs: Not on file  ?Physical Activity: Not on file  ?Stress: Not on file   ?Social Connections: Not on file  ?Intimate Partner Violence: Not on file  ? ? ?Outpatient Medications Prior to Visit  ?Medication Sig Dispense Refill  ? albuterol (PROAIR HFA) 108 (90 Base) MCG/ACT inhaler Inhale 1-2 puffs into the lungs every 6 (six) hours as needed for wheezing or shortness of breath. 8 g 0  ? cephALEXin (KEFLEX) 500 MG capsule Take 1 capsule (500 mg total) by mouth 4 (four) times daily. 40 capsule 0  ? etonogestrel (NEXPLANON) 68 MG IMPL implant 1 each by Subdermal route once.    ? promethazine (PHENERGAN) 25 MG tablet Take 1 tablet (25 mg total) by mouth every 6 (six) hours as needed for nausea or vomiting. 10 tablet 0  ? venlafaxine XR (EFFEXOR XR) 75 MG 24 hr capsule Take 1 capsule (75 mg total) by mouth daily with breakfast. 30 capsule 3  ? ?No facility-administered medications prior to visit.  ? ? ?Allergies  ?Allergen Reactions  ? Ciprofloxacin Itching  ? Penicillins Hives  ?  Has patient had a PCN reaction causing immediate rash, facial/tongue/throat swelling, SOB or lightheadedness with hypotension: yes ?Has patient had a PCN reaction causing severe rash involving mucus  membranes or skin necrosis: no ?Has patient had a PCN reaction that required hospitalization no ?Has patient had a PCN reaction occurring within the last 10 years: yes ?If all of the above answers are "NO", then may proceed with Cephalosporin use.  ? Soap Rash  ?  Has to use sensitive skin products  ? ? ?Review of Systems  ?Constitutional:  Negative for fever.  ?HENT:  Negative for congestion, ear pain, hearing loss, sinus pain and sore throat.   ?Eyes:  Negative for blurred vision and pain.  ?Respiratory:  Negative for cough, sputum production, shortness of breath and wheezing.   ?Cardiovascular:  Negative for chest pain and palpitations.  ?Gastrointestinal:  Negative for blood in stool, constipation, diarrhea, nausea and vomiting.  ?Genitourinary:  Negative for dysuria, frequency, hematuria and urgency.   ?Musculoskeletal:  Negative for back pain, falls and myalgias.  ?Neurological:  Negative for dizziness, sensory change, loss of consciousness, weakness and headaches.  ?Endo/Heme/Allergies:  Negative for environmental allergies. Does not bruise/bleed easily.  ?Psychiatric/Behavioral:  Negative for depression and suicidal ideas. The patient is not nervous/anxious and does not have insomnia.   ? ?   ?Objective:  ?  ?Physical Exam ?Constitutional:   ?   General: She is not in acute distress. ?   Appearance: Normal appearance. She is not ill-appearing.  ?HENT:  ?   Head: Normocephalic and atraumatic.  ?   Right Ear: External ear normal.  ?   Left Ear: External ear normal.  ?Eyes:  ?   Extraocular Movements: Extraocular movements intact.  ?   Pupils: Pupils are equal, round, and reactive to light.  ?Cardiovascular:  ?   Rate and Rhythm: Normal rate and regular rhythm.  ?   Pulses: Normal pulses.  ?   Heart sounds: Normal heart sounds. No murmur heard. ?Pulmonary:  ?   Effort: Pulmonary effort is normal. No respiratory distress.  ?   Breath sounds: Normal breath sounds. No wheezing or rhonchi.  ?Abdominal:  ?   General: Bowel sounds are normal. There is no distension.  ?   Palpations: Abdomen is soft.  ?   Tenderness: There is no abdominal tenderness. There is no guarding or rebound.  ?Musculoskeletal:  ?   Cervical back: Neck supple.  ?Lymphadenopathy:  ?   Cervical: No cervical adenopathy.  ?Skin: ?   General: Skin is warm and dry.  ?Neurological:  ?   Mental Status: She is alert and oriented to person, place, and time.  ?Psychiatric:     ?   Behavior: Behavior normal.     ?   Judgment: Judgment normal.  ? ? ?LMP  (LMP Unknown) Comment: vaginal bleeding for 1 month ?Wt Readings from Last 3 Encounters:  ?06/26/21 185 lb 3.2 oz (84 kg)  ?06/26/21 185 lb 3.2 oz (84 kg)  ?06/12/21 150 lb (68 kg)  ? ? ?Diabetic Foot Exam - Simple   ?No data filed ?  ? ?Lab Results  ?Component Value Date  ? WBC 5.2 06/26/2021  ? HGB 13.2  06/26/2021  ? HCT 40.4 06/26/2021  ? PLT 433.0 (H) 06/26/2021  ? GLUCOSE 88 06/26/2021  ? ALT 23 06/12/2021  ? AST 21 06/12/2021  ? NA 138 06/26/2021  ? K 4.3 06/26/2021  ? CL 105 06/26/2021  ? CREATININE 0.67 06/26/2021  ? BUN 10 06/26/2021  ? CO2 26 06/26/2021  ? ? ?No results found for: TSH ?Lab Results  ?Component Value Date  ? WBC 5.2 06/26/2021  ?  HGB 13.2 06/26/2021  ? HCT 40.4 06/26/2021  ? MCV 89.2 06/26/2021  ? PLT 433.0 (H) 06/26/2021  ? ?Lab Results  ?Component Value Date  ? NA 138 06/26/2021  ? K 4.3 06/26/2021  ? CO2 26 06/26/2021  ? GLUCOSE 88 06/26/2021  ? BUN 10 06/26/2021  ? CREATININE 0.67 06/26/2021  ? BILITOT 0.9 06/12/2021  ? ALKPHOS 46 06/12/2021  ? AST 21 06/12/2021  ? ALT 23 06/12/2021  ? PROT 6.8 06/12/2021  ? ALBUMIN 3.6 06/12/2021  ? CALCIUM 9.4 06/26/2021  ? ANIONGAP 10 06/12/2021  ? GFR 125.87 06/26/2021  ? ?No results found for: CHOL ?No results found for: HDL ?No results found for: LDLCALC ?No results found for: TRIG ?No results found for: CHOLHDL ?No results found for: HGBA1C ? ?   ?Assessment & Plan:  ? ?Problem List Items Addressed This Visit   ?None ? ? ?No orders of the defined types were placed in this encounter. ? ? ?I,Zite Okoli,acting as a Neurosurgeon for [employer], NP.,have documented all relevant documentation on the behalf of Lemont Fillers, NP,as directed by  Lemont Fillers, NP while in the presence of Lemont Fillers, NP.  ? ?I, Sandford Craze, NP, personally preformed the services described in this documentation.  All medical record entries made by the scribe were at my direction and in my presence.  I have reviewed the chart and discharge instructions (if applicable) and agree that the record reflects my personal performance and is accurate and complete. 07/01/2021 ?

## 2021-07-03 NOTE — Telephone Encounter (Signed)
Transition Care Management Follow-up Telephone Call ?Date of discharge and from where: 06/27/2021 from [facility] ?How have you been since you were released from the hospital? Patient stated that she is feeling some better. Patient stated that she is drinking plenty of fluids and is able to void without pain or difficulty at this time.  ?Any questions or concerns? No ? ?Items Reviewed: ?Did the pt receive and understand the discharge instructions provided? Yes  ?Medications obtained and verified? Yes  ?Other? No  ?Any new allergies since your discharge? No  ?Dietary orders reviewed? No ?Do you have support at home? Yes  ? ?Functional Questionnaire: (I = Independent and D = Dependent) ?ADLs: I ? ?Bathing/Dressing- I ? ?Meal Prep- I ? ?Eating- I ? ?Maintaining continence- I ? ?Transferring/Ambulation- I ? ?Managing Meds- I ? ? ?Follow up appointments reviewed: ? ?PCP Hospital f/u appt confirmed? No   ?Specialist Hospital f/u appt confirmed? No   ?Are transportation arrangements needed? No  ?If their condition worsens, is the pt aware to call PCP or go to the Emergency Dept.? Yes ?Was the patient provided with contact information for the PCP's office or ED? Yes ?Was to pt encouraged to call back with questions or concerns? Yes ? ?

## 2021-07-08 DIAGNOSIS — J069 Acute upper respiratory infection, unspecified: Secondary | ICD-10-CM | POA: Diagnosis not present

## 2021-07-08 DIAGNOSIS — R051 Acute cough: Secondary | ICD-10-CM | POA: Diagnosis not present

## 2021-07-15 DIAGNOSIS — J069 Acute upper respiratory infection, unspecified: Secondary | ICD-10-CM | POA: Diagnosis not present

## 2021-07-15 DIAGNOSIS — R051 Acute cough: Secondary | ICD-10-CM | POA: Diagnosis not present

## 2021-08-13 ENCOUNTER — Encounter: Payer: Medicaid Other | Admitting: Advanced Practice Midwife

## 2021-08-27 DIAGNOSIS — Z6832 Body mass index (BMI) 32.0-32.9, adult: Secondary | ICD-10-CM | POA: Diagnosis not present

## 2021-08-27 DIAGNOSIS — L309 Dermatitis, unspecified: Secondary | ICD-10-CM | POA: Diagnosis not present

## 2021-08-28 DIAGNOSIS — Z113 Encounter for screening for infections with a predominantly sexual mode of transmission: Secondary | ICD-10-CM | POA: Diagnosis not present

## 2021-08-28 DIAGNOSIS — R300 Dysuria: Secondary | ICD-10-CM | POA: Diagnosis not present

## 2021-08-28 DIAGNOSIS — Z0000 Encounter for general adult medical examination without abnormal findings: Secondary | ICD-10-CM | POA: Diagnosis not present

## 2021-09-04 DIAGNOSIS — Z3202 Encounter for pregnancy test, result negative: Secondary | ICD-10-CM | POA: Diagnosis not present

## 2021-09-04 DIAGNOSIS — Z3046 Encounter for surveillance of implantable subdermal contraceptive: Secondary | ICD-10-CM | POA: Diagnosis not present

## 2021-09-07 DIAGNOSIS — R5383 Other fatigue: Secondary | ICD-10-CM | POA: Diagnosis not present

## 2021-09-07 DIAGNOSIS — Z6833 Body mass index (BMI) 33.0-33.9, adult: Secondary | ICD-10-CM | POA: Diagnosis not present

## 2021-09-07 DIAGNOSIS — E7800 Pure hypercholesterolemia, unspecified: Secondary | ICD-10-CM | POA: Diagnosis not present

## 2021-09-07 DIAGNOSIS — E559 Vitamin D deficiency, unspecified: Secondary | ICD-10-CM | POA: Diagnosis not present

## 2021-09-07 DIAGNOSIS — Z79899 Other long term (current) drug therapy: Secondary | ICD-10-CM | POA: Diagnosis not present

## 2021-09-07 DIAGNOSIS — R0602 Shortness of breath: Secondary | ICD-10-CM | POA: Diagnosis not present

## 2021-09-30 ENCOUNTER — Telehealth: Payer: Medicaid Other | Admitting: Physician Assistant

## 2021-09-30 ENCOUNTER — Encounter: Payer: Self-pay | Admitting: Physician Assistant

## 2021-09-30 ENCOUNTER — Encounter: Payer: Self-pay | Admitting: Family Medicine

## 2021-09-30 DIAGNOSIS — F419 Anxiety disorder, unspecified: Secondary | ICD-10-CM | POA: Diagnosis not present

## 2021-09-30 DIAGNOSIS — F32A Depression, unspecified: Secondary | ICD-10-CM

## 2021-09-30 DIAGNOSIS — Z6833 Body mass index (BMI) 33.0-33.9, adult: Secondary | ICD-10-CM | POA: Diagnosis not present

## 2021-09-30 DIAGNOSIS — F331 Major depressive disorder, recurrent, moderate: Secondary | ICD-10-CM | POA: Diagnosis not present

## 2021-09-30 NOTE — Progress Notes (Signed)
Cindy Townsend, carusone are scheduled for a virtual visit with your provider today.    Just as we do with appointments in the office, we must obtain your consent to participate.  Your consent will be active for this visit and any virtual visit you may have with one of our providers in the next 365 days.    If you have a MyChart account, I can also send a copy of this consent to you electronically.  All virtual visits are billed to your insurance company just like a traditional visit in the office.  As this is a virtual visit, video technology does not allow for your provider to perform a traditional examination.  This may limit your provider's ability to fully assess your condition.  If your provider identifies any concerns that need to be evaluated in person or the need to arrange testing such as labs, EKG, etc, we will make arrangements to do so.    Although advances in technology are sophisticated, we cannot ensure that it will always work on either your end or our end.  If the connection with a video visit is poor, we may have to switch to a telephone visit.  With either a video or telephone visit, we are not always able to ensure that we have a secure connection.   I need to obtain your verbal consent now.   Are you willing to proceed with your visit today?   QUANITA BARONA has provided verbal consent on 09/30/2021 for a virtual visit (video or telephone).   Karrie Meres, PA-C 09/30/2021  11:23 AM   Date:  09/30/2021   ID:  Cindy Townsend, DOB 10/07/2000, MRN [id number]  Patient Location: Home Provider Location: Other:  In car - not driving   Participants: Patient and Provider for Visit and Wrap up  Method of visit: Video  Location of Patient: Home Location of Provider: Home Office Consent was obtain for visit over the video. Services rendered by provider: Visit was performed via video  A video enabled telemedicine application was used and I verified that I am speaking with the  correct person using two identifiers.  PCP:  Bradd Canary, MD   Chief Complaint:  anxiety/depression  History of Present Illness:    Cindy Townsend is a 21 y.o. female with history as stated below. Presents video telehealth for an acute care visit  Pt has a h/o anxiety/depression. States she feels anxious and overwhelmed all the time. She states that for the last 3-4 months her symptoms have been worse. Denies SI. She is not on medication for this. She has not made an appointment with her provider about this. She is not in therapy.   Past Medical, Surgical, Social History, Allergies, and Medications have been Reviewed.  Past Medical History:  Diagnosis Date   ADHD (attention deficit hyperactivity disorder)    ADHD   Anxiety state 07/24/2014   Asthma    triggered by exercise and URI; prn inhaler   Chronic otitis media 05/2011    No outpatient medications have been marked as taking for the 09/30/21 encounter (Video Visit) with Calden Dorsey S, PA-C.     Allergies:   Ciprofloxacin, Penicillins, and Soap   ROS See HPI for history of present illness.  Physical Exam Vitals reviewed.  Constitutional:      Appearance: Normal appearance.  Neurological:     Mental Status: She is alert.  Psychiatric:        Attention and Perception: Attention  normal.        Mood and Affect: Mood normal.        Speech: Speech normal.        Behavior: Behavior normal.        Thought Content: Thought content does not include suicidal ideation. Thought content does not include suicidal plan.           MDM: Pt with anxiety and depression for several months. Previously tx with venlafaxine however rx ran out and pt has not f/u for appt. No si reported. Advised to f/u with pcp for reinitiating of this medication and for further eval.  There are no diagnoses linked to this encounter.   Time:   Today, I have spent 5 minutes with the patient with telehealth technology discussing the above  problems, reviewing the chart, previous notes, medications and orders.    Tests Ordered: No orders of the defined types were placed in this encounter.   Medication Changes: No orders of the defined types were placed in this encounter.    Disposition:  Follow up  Signed, Karrie Meres, PA-C  09/30/2021 11:23 AM

## 2021-09-30 NOTE — Patient Instructions (Signed)
  Cindy Townsend, thank you for joining Rodney Booze, PA-C for today's virtual visit.  While this provider is not your primary care provider (PCP), if your PCP is located in our provider database this encounter information will be shared with them immediately following your visit.  Consent: (Patient) Cindy Townsend provided verbal consent for this virtual visit at the beginning of the encounter.  Current Medications:  Current Outpatient Medications:    albuterol (PROAIR HFA) 108 (90 Base) MCG/ACT inhaler, Inhale 1-2 puffs into the lungs every 6 (six) hours as needed for wheezing or shortness of breath., Disp: 8 g, Rfl: 0   cephALEXin (KEFLEX) 500 MG capsule, Take 1 capsule (500 mg total) by mouth 4 (four) times daily., Disp: 40 capsule, Rfl: 0   etonogestrel (NEXPLANON) 68 MG IMPL implant, 1 each by Subdermal route once., Disp: , Rfl:    promethazine (PHENERGAN) 25 MG tablet, Take 1 tablet (25 mg total) by mouth every 6 (six) hours as needed for nausea or vomiting., Disp: 10 tablet, Rfl: 0   venlafaxine XR (EFFEXOR XR) 75 MG 24 hr capsule, Take 1 capsule (75 mg total) by mouth daily with breakfast., Disp: 30 capsule, Rfl: 3   Medications ordered in this encounter:  No orders of the defined types were placed in this encounter.    *If you need refills on other medications prior to your next appointment, please contact your pharmacy*  Follow-Up: Call back or seek an in-person evaluation if the symptoms worsen or if the condition fails to improve as anticipated.  If you have been instructed to have an in-person evaluation today at a local Urgent Care facility, please use the link below. It will take you to a list of all of our available [facility], including address, phone number and hours of operation. Please do not delay care.  [facility]  If you or a family member do not have a primary care provider, use the link below to schedule a visit and establish  care. When you choose a [facility] primary care physician or advanced practice provider, you gain a long-term partner in health. Find a Primary Care Provider  Learn more about [facility]'s in-office and virtual care options: [facility]

## 2021-10-02 ENCOUNTER — Ambulatory Visit: Payer: Medicaid Other | Admitting: Family Medicine

## 2021-10-04 DIAGNOSIS — A4151 Sepsis due to Escherichia coli [E. coli]: Secondary | ICD-10-CM | POA: Diagnosis not present

## 2021-10-04 DIAGNOSIS — A419 Sepsis, unspecified organism: Secondary | ICD-10-CM | POA: Diagnosis not present

## 2021-10-04 DIAGNOSIS — R55 Syncope and collapse: Secondary | ICD-10-CM | POA: Diagnosis not present

## 2021-10-04 DIAGNOSIS — R079 Chest pain, unspecified: Secondary | ICD-10-CM | POA: Diagnosis not present

## 2021-10-04 DIAGNOSIS — I959 Hypotension, unspecified: Secondary | ICD-10-CM | POA: Diagnosis not present

## 2021-10-04 DIAGNOSIS — Z3202 Encounter for pregnancy test, result negative: Secondary | ICD-10-CM | POA: Diagnosis not present

## 2021-10-04 DIAGNOSIS — N119 Chronic tubulo-interstitial nephritis, unspecified: Secondary | ICD-10-CM | POA: Diagnosis not present

## 2021-10-04 DIAGNOSIS — R6520 Severe sepsis without septic shock: Secondary | ICD-10-CM | POA: Diagnosis not present

## 2021-10-04 DIAGNOSIS — F329 Major depressive disorder, single episode, unspecified: Secondary | ICD-10-CM | POA: Diagnosis not present

## 2021-10-04 DIAGNOSIS — N12 Tubulo-interstitial nephritis, not specified as acute or chronic: Secondary | ICD-10-CM | POA: Diagnosis not present

## 2021-10-04 DIAGNOSIS — I951 Orthostatic hypotension: Secondary | ICD-10-CM | POA: Diagnosis not present

## 2021-10-04 DIAGNOSIS — N200 Calculus of kidney: Secondary | ICD-10-CM | POA: Diagnosis not present

## 2021-10-04 DIAGNOSIS — F411 Generalized anxiety disorder: Secondary | ICD-10-CM | POA: Diagnosis not present

## 2021-10-04 DIAGNOSIS — R109 Unspecified abdominal pain: Secondary | ICD-10-CM | POA: Diagnosis not present

## 2021-10-04 DIAGNOSIS — R0789 Other chest pain: Secondary | ICD-10-CM | POA: Diagnosis not present

## 2021-10-04 DIAGNOSIS — R000 Tachycardia, unspecified: Secondary | ICD-10-CM | POA: Diagnosis not present

## 2021-10-05 DIAGNOSIS — N12 Tubulo-interstitial nephritis, not specified as acute or chronic: Secondary | ICD-10-CM | POA: Diagnosis not present

## 2021-10-05 DIAGNOSIS — N200 Calculus of kidney: Secondary | ICD-10-CM | POA: Diagnosis not present

## 2021-10-05 DIAGNOSIS — K5730 Diverticulosis of large intestine without perforation or abscess without bleeding: Secondary | ICD-10-CM | POA: Diagnosis not present

## 2021-10-07 DIAGNOSIS — A419 Sepsis, unspecified organism: Secondary | ICD-10-CM | POA: Diagnosis not present

## 2021-10-07 DIAGNOSIS — R55 Syncope and collapse: Secondary | ICD-10-CM | POA: Diagnosis not present

## 2021-10-07 DIAGNOSIS — Z299 Encounter for prophylactic measures, unspecified: Secondary | ICD-10-CM | POA: Diagnosis not present

## 2021-10-07 DIAGNOSIS — F411 Generalized anxiety disorder: Secondary | ICD-10-CM | POA: Diagnosis not present

## 2021-10-07 DIAGNOSIS — F329 Major depressive disorder, single episode, unspecified: Secondary | ICD-10-CM | POA: Diagnosis not present

## 2021-10-07 DIAGNOSIS — N200 Calculus of kidney: Secondary | ICD-10-CM | POA: Diagnosis not present

## 2021-10-10 DIAGNOSIS — B9620 Unspecified Escherichia coli [E. coli] as the cause of diseases classified elsewhere: Secondary | ICD-10-CM | POA: Diagnosis not present

## 2021-10-10 DIAGNOSIS — N119 Chronic tubulo-interstitial nephritis, unspecified: Secondary | ICD-10-CM | POA: Diagnosis not present

## 2021-10-11 ENCOUNTER — Other Ambulatory Visit: Payer: Medicaid Other

## 2021-10-11 ENCOUNTER — Telehealth (INDEPENDENT_AMBULATORY_CARE_PROVIDER_SITE_OTHER): Payer: Medicaid Other | Admitting: Family

## 2021-10-11 DIAGNOSIS — N12 Tubulo-interstitial nephritis, not specified as acute or chronic: Secondary | ICD-10-CM | POA: Diagnosis not present

## 2021-10-11 NOTE — Assessment & Plan Note (Addendum)
She is slowly improving.  I have asked her to come in this afternoon to repeat labs below.  She is working on getting back in with Dr. Sabino Gasser (Urology). In the meantime, I have advised her to complete her prescribed 14 day course of cipro and then transition to macrobid prophylaxis that was prescribed at discharge.  She is advised to return the ER if worsening symptoms.  I have advised her to stay out of work next week.

## 2021-10-11 NOTE — Progress Notes (Signed)
MyChart Video Visit    Virtual Visit via Video Note   This visit type was conducted due to national recommendations for restrictions regarding the COVID-19 Pandemic (e.g. social distancing) in an effort to limit this patient's exposure and mitigate transmission in our community. This patient is at least at moderate risk for complications without adequate follow up. This format is felt to be most appropriate for this patient at this time. Physical exam was limited by quality of the video and audio technology used for the visit. CMA was able to get the patient set up on a video visit.  Patient location: Home Patient and provider in visit Provider location: Office  I discussed the limitations of evaluation and management by telemedicine and the availability of in person appointments. The patient expressed understanding and agreed to proceed.  Visit Date: 10/11/2021  Today's healthcare provider: Lemont Fillers, NP     Subjective:    Patient ID: Cindy Townsend, female    DOB: 2000-12-23, 21 y.o.   MRN: [id number]  Chief Complaint  Patient presents with   Pyelonephritis    Here for follow up after hospitalization for pyelonephritis and sepsis   Generalized Body Aches    Patient reports still having body aches    HPI Patient is in today for a virtual office visit  ED Follow-up for Pyelonephritis: She was admitted to the ED on 10/04/2021 for fever, pelvic and back pain, and dizziness. CT noted non-obstructing kidney stones.  During her stay, she was diagnosed with pyelonephritis. IV abx included zosyn and vancomycin. She was discharged on 10/07/2021. She reports that since being discharged, her back pain is persistent. When she walks, pain worsens. She states that pain is worse on her left side than her right. She also reports that she feels hot and then cold. She is currently employed and is wondering if she should go back to work on Monday. She tried to make a follow-up  appointment with Dr.Mullins but was informed that she cannot be seen until August. She is trying to get in sooner. She was prescribed 500 Mg of Cipro that she is currently taking until 10/18/2021. She was also prescribed 100 Mg of Macrobid that she is planning to take after finishing Cipro.    Past Medical History:  Diagnosis Date   ADHD (attention deficit hyperactivity disorder)    ADHD   Anxiety state 07/24/2014   Asthma    triggered by exercise and URI; prn inhaler   Chronic otitis media 05/2011    Past Surgical History:  Procedure Laterality Date   TONSILLECTOMY     TYMPANOSTOMY TUBE PLACEMENT  2012    Family History  Problem Relation Age of Onset   Asthma Mother    Anesthesia problems Mother        post-op nausea   Hypertension Maternal Grandmother    Asthma Maternal Grandmother    Diabetes Paternal Grandmother        type 2- controlled by diet    Social History   Socioeconomic History   Marital status: Single    Spouse name: Not on file   Number of children: Not on file   Years of education: Not on file   Highest education level: Not on file  Occupational History   Not on file  Tobacco Use   Smoking status: Never    Passive exposure: Never   Smokeless tobacco: Never  Vaping Use   Vaping Use: Never used  Substance and Sexual Activity  Alcohol use: No   Drug use: No   Sexual activity: Yes    Birth control/protection: None  Other Topics Concern   Not on file  Social History Narrative   Not on file   Social Determinants of Health   Financial Resource Strain: Low Risk  (07/14/2018)   Overall Financial Resource Strain (CARDIA)    Difficulty of Paying Living Expenses: Not hard at all  Food Insecurity: No Food Insecurity (07/14/2018)   Hunger Vital Sign    Worried About Running Out of Food in the Last Year: Never true    Ran Out of Food in the Last Year: Never true  Transportation Needs: Unknown (07/14/2018)   PRAPARE - Scientist, research (physical sciences) (Medical): No    Lack of Transportation (Non-Medical): Not on file  Physical Activity: Not on file  Stress: Not on file  Social Connections: Not on file  Intimate Partner Violence: Not At Risk (07/14/2018)   Humiliation, Afraid, Rape, and Kick questionnaire    Fear of Current or Ex-Partner: No    Emotionally Abused: No    Physically Abused: No    Sexually Abused: No    Outpatient Medications Prior to Visit  Medication Sig Dispense Refill   albuterol (PROAIR HFA) 108 (90 Base) MCG/ACT inhaler Inhale 1-2 puffs into the lungs every 6 (six) hours as needed for wheezing or shortness of breath. 8 g 0   ciprofloxacin (CIPRO) 500 MG tablet Take 500 mg by mouth 2 (two) times daily.     etonogestrel (NEXPLANON) 68 MG IMPL implant 1 each by Subdermal route once.     venlafaxine XR (EFFEXOR XR) 75 MG 24 hr capsule Take 1 capsule (75 mg total) by mouth daily with breakfast. 30 capsule 3   cephALEXin (KEFLEX) 500 MG capsule Take 1 capsule (500 mg total) by mouth 4 (four) times daily. 40 capsule 0   promethazine (PHENERGAN) 25 MG tablet Take 1 tablet (25 mg total) by mouth every 6 (six) hours as needed for nausea or vomiting. 10 tablet 0   No facility-administered medications prior to visit.    Allergies  Allergen Reactions   Ciprofloxacin Itching   Penicillins Hives    Has patient had a PCN reaction causing immediate rash, facial/tongue/throat swelling, SOB or lightheadedness with hypotension: yes Has patient had a PCN reaction causing severe rash involving mucus membranes or skin necrosis: no Has patient had a PCN reaction that required hospitalization no Has patient had a PCN reaction occurring within the last 10 years: yes If all of the above answers are "NO", then may proceed with Cephalosporin use.   Soap Rash    Has to use sensitive skin products    Review of Systems  Constitutional:        (+) Hot Flashes (+) Cold Flashes  Musculoskeletal:  Positive for back pain  (Worse on Left Side).       Objective:    Physical Exam  Gen: Awake, alert, no acute distress- appears tired Resp: Breathing is even and non-labored Psych: calm/pleasant demeanor Neuro: Alert and Oriented x 3, + facial symmetry, speech is clear.      Assessment & Plan:   Problem List Items Addressed This Visit       Unprioritized   Pyelonephritis - Primary    She is slowly improving.  I have asked her to come in this afternoon to repeat labs below.  She is working on getting back in with Dr. Sabino Gasser (Urology). In the  meantime, I have advised her to complete her prescribed 14 day course of cipro and then transition to macrobid prophylaxis that was prescribed at discharge.  She is advised to return the ER if worsening symptoms.  I have advised her to stay out of work next week.       Relevant Orders   Basic metabolic panel   CBC with Differential/Platelet   Urine Culture    No orders of the defined types were placed in this encounter.   I discussed the assessment and treatment plan with the patient. The patient was provided an opportunity to ask questions and all were answered. The patient agreed with the plan and demonstrated an understanding of the instructions.   The patient was advised to call back or seek an in-person evaluation if the symptoms worsen or if the condition fails to improve as anticipated.  I provided 20 minutes of face-to-face time during this encounter.   I,Amber Collins,acting as a Neurosurgeon for [employer], NP.,have documented all relevant documentation on the behalf of Lemont Fillers, NP,as directed by  Lemont Fillers, NP while in the presence of Lemont Fillers, NP.   Lemont Fillers, NP [employer] [facility] [employer] [phone] (phone) [phone] (fax)  [facility]

## 2021-10-16 ENCOUNTER — Encounter: Payer: Self-pay | Admitting: Family

## 2021-10-16 NOTE — Progress Notes (Signed)
Subjective:    Patient ID: Cindy Townsend, female    DOB: 2001-01-29, 21 y.o.   MRN: [id number]  Chief Complaint  Patient presents with   Hospitalization Follow-up    HPI Patient is in today for a hospital follow up and she is accompanied by her mother for hospital follow up. She was hospitalized with pyelonephritis after a very bad urinary tract infection. She continues to feel fatigued and hot and flushed at times. She had 2 syncopal episodes prior to hospitalizations and not since then. She did have a couple of episodes of feeling weak in the past 24 hours. Denies CP/palp/SOB/HA/congestion/fevers/GI c/o. Taking meds as prescribed   Past Medical History:  Diagnosis Date   ADHD (attention deficit hyperactivity disorder)    ADHD   Anxiety state 07/24/2014   Asthma    triggered by exercise and URI; prn inhaler   Chronic otitis media 05/2011    Past Surgical History:  Procedure Laterality Date   TONSILLECTOMY     TYMPANOSTOMY TUBE PLACEMENT  2012    Family History  Problem Relation Age of Onset   Asthma Mother    Anesthesia problems Mother        post-op nausea   Hypertension Maternal Grandmother    Asthma Maternal Grandmother    Diabetes Paternal Grandmother        type 2- controlled by diet    Social History   Socioeconomic History   Marital status: Single    Spouse name: Not on file   Number of children: Not on file   Years of education: Not on file   Highest education level: Not on file  Occupational History   Not on file  Tobacco Use   Smoking status: Never    Passive exposure: Never   Smokeless tobacco: Never  Vaping Use   Vaping Use: Never used  Substance and Sexual Activity   Alcohol use: No   Drug use: No   Sexual activity: Yes    Birth control/protection: None  Other Topics Concern   Not on file  Social History Narrative   Not on file   Social Determinants of Health   Financial Resource Strain: Low Risk  (07/14/2018)   Overall Financial  Resource Strain (CARDIA)    Difficulty of Paying Living Expenses: Not hard at all  Food Insecurity: No Food Insecurity (07/14/2018)   Hunger Vital Sign    Worried About Running Out of Food in the Last Year: Never true    Ran Out of Food in the Last Year: Never true  Transportation Needs: Unknown (07/14/2018)   PRAPARE - Administrator, Civil Service (Medical): No    Lack of Transportation (Non-Medical): Not on file  Physical Activity: Not on file  Stress: Not on file  Social Connections: Not on file  Intimate Partner Violence: Not At Risk (07/14/2018)   Humiliation, Afraid, Rape, and Kick questionnaire    Fear of Current or Ex-Partner: No    Emotionally Abused: No    Physically Abused: No    Sexually Abused: No    Outpatient Medications Prior to Visit  Medication Sig Dispense Refill   albuterol (PROAIR HFA) 108 (90 Base) MCG/ACT inhaler Inhale 1-2 puffs into the lungs every 6 (six) hours as needed for wheezing or shortness of breath. 8 g 0   busPIRone (BUSPAR) 7.5 MG tablet Take 7.5 mg by mouth 2 (two) times daily.     ciprofloxacin (CIPRO) 500 MG tablet Take 500 mg by  mouth 2 (two) times daily.     etonogestrel (NEXPLANON) 68 MG IMPL implant 1 each by Subdermal route once.     polyethylene glycol (MIRALAX / GLYCOLAX) 17 g packet Take by mouth.     sertraline (ZOLOFT) 50 MG tablet Take 1 tablet by mouth daily.     venlafaxine XR (EFFEXOR XR) 75 MG 24 hr capsule Take 1 capsule (75 mg total) by mouth daily with breakfast. 30 capsule 3   ciprofloxacin (CIPRO) 500 MG tablet Take by mouth.     nitrofurantoin, macrocrystal-monohydrate, (MACROBID) 100 MG capsule Take 100 mg by mouth daily. (Patient not taking: Reported on 10/17/2021)     No facility-administered medications prior to visit.    Allergies  Allergen Reactions   Ciprofloxacin Itching   Penicillins Hives    Has patient had a PCN reaction causing immediate rash, facial/tongue/throat swelling, SOB or lightheadedness  with hypotension: yes Has patient had a PCN reaction causing severe rash involving mucus membranes or skin necrosis: no Has patient had a PCN reaction that required hospitalization no Has patient had a PCN reaction occurring within the last 10 years: yes If all of the above answers are "NO", then may proceed with Cephalosporin use.   Soap Rash    Has to use sensitive skin products    Review of Systems  Constitutional:  Positive for fever and malaise/fatigue.  HENT:  Negative for congestion.   Eyes:  Negative for blurred vision.  Respiratory:  Negative for shortness of breath.   Cardiovascular:  Negative for chest pain, palpitations and leg swelling.  Gastrointestinal:  Negative for abdominal pain, blood in stool and nausea.  Genitourinary:  Positive for frequency and urgency. Negative for dysuria and hematuria.  Musculoskeletal:  Positive for back pain and myalgias. Negative for falls.  Skin:  Negative for rash.  Neurological:  Negative for dizziness, loss of consciousness and headaches.  Endo/Heme/Allergies:  Negative for environmental allergies.  Psychiatric/Behavioral:  Negative for depression. The patient is nervous/anxious.        Objective:    Physical Exam Constitutional:      General: She is not in acute distress.    Appearance: She is well-developed.  HENT:     Head: Normocephalic and atraumatic.  Eyes:     Conjunctiva/sclera: Conjunctivae normal.  Neck:     Thyroid: No thyromegaly.  Cardiovascular:     Rate and Rhythm: Normal rate and regular rhythm.     Heart sounds: Normal heart sounds. No murmur heard. Pulmonary:     Effort: Pulmonary effort is normal. No respiratory distress.     Breath sounds: Normal breath sounds.  Abdominal:     General: Bowel sounds are normal. There is no distension.     Palpations: Abdomen is soft. There is no mass.     Tenderness: There is no abdominal tenderness.  Musculoskeletal:     Cervical back: Neck supple.  Lymphadenopathy:      Cervical: No cervical adenopathy.  Skin:    General: Skin is warm and dry.  Neurological:     Mental Status: She is alert and oriented to person, place, and time.  Psychiatric:        Behavior: Behavior normal.     BP 106/80 (BP Location: Right Arm, Patient Position: Sitting, Cuff Size: Normal)   Pulse 88   Resp 20   Ht 5\' 3"  (1.6 m)   Wt 176 lb (79.8 kg)   SpO2 98%   BMI 31.18 kg/m  Wt Readings from  Last 3 Encounters:  10/17/21 176 lb (79.8 kg)  06/26/21 185 lb 3.2 oz (84 kg)  06/26/21 185 lb 3.2 oz (84 kg)    Diabetic Foot Exam - Simple   No data filed    Lab Results  Component Value Date   WBC 5.2 10/17/2021   HGB 13.7 10/17/2021   HCT 40.7 10/17/2021   PLT 577.0 (H) 10/17/2021   GLUCOSE 80 10/17/2021   ALT 25 10/17/2021   AST 15 10/17/2021   NA 138 10/17/2021   K 4.6 10/17/2021   CL 104 10/17/2021   CREATININE 0.84 10/17/2021   BUN 11 10/17/2021   CO2 26 10/17/2021   TSH 1.27 10/17/2021   HGBA1C 5.3 10/17/2021    Lab Results  Component Value Date   TSH 1.27 10/17/2021   Lab Results  Component Value Date   WBC 5.2 10/17/2021   HGB 13.7 10/17/2021   HCT 40.7 10/17/2021   MCV 87.8 10/17/2021   PLT 577.0 (H) 10/17/2021   Lab Results  Component Value Date   NA 138 10/17/2021   K 4.6 10/17/2021   CO2 26 10/17/2021   GLUCOSE 80 10/17/2021   BUN 11 10/17/2021   CREATININE 0.84 10/17/2021   BILITOT 0.5 10/17/2021   ALKPHOS 62 10/17/2021   AST 15 10/17/2021   ALT 25 10/17/2021   PROT 7.4 10/17/2021   ALBUMIN 4.4 10/17/2021   CALCIUM 9.9 10/17/2021   ANIONGAP 10 06/12/2021   GFR 99.86 10/17/2021   No results found for: "CHOL" No results found for: "HDL" No results found for: "LDLCALC" No results found for: "TRIG" No results found for: "CHOLHDL" Lab Results  Component Value Date   HGBA1C 5.3 10/17/2021       Assessment & Plan:      Problem List Items Addressed This Visit     Anxiety state    Much better on combination of  meds no changes      Relevant Medications   sertraline (ZOLOFT) 50 MG tablet   busPIRone (BUSPAR) 7.5 MG tablet   Pyelonephritis - Primary    With recent episode of sepsis and hospitalization. Has been referred to urology for further consideration but has not seen them yet. Start a probiotic and a fiber supplement, hydrate well and report worsening symptoms, check labs      Relevant Medications   nitrofurantoin, macrocrystal-monohydrate, (MACROBID) 100 MG capsule   Other Relevant Orders   CBC w/Diff (Completed)   Sedimentation rate (Completed)   Urinalysis (Completed)   Urine Culture   UTI (urinary tract infection)    With recent episode of sepsis and hospitalization. Has been referred to urology for further consideration but has not seen them yet. Start a probiotic and a fiber supplement, hydrate well and report worsening symptoms      Relevant Medications   nitrofurantoin, macrocrystal-monohydrate, (MACROBID) 100 MG capsule   Other Relevant Orders   Comprehensive metabolic panel (Completed)   CBC w/Diff (Completed)   Syncope    Had an episode the day before she was hospitalized and then she passed out again the day she was hospitalized. Was OK til yesterday when she had two presyncopal episodes. She has also had some hot flashes yesterday worried about persistent infection, check blood and urine today consider changing antibiotic and she will let us know if she has worse symptoms.       Hyperglycemia    Check hgba1c today      Relevant Orders   Comprehensive metabolic panel (Completed)  TSH (Completed)   Hemoglobin A1c (Completed)   Low back pain    Intermittent better today. Encouraged moist heat and gentle stretching as tolerated. May try NSAIDs and prescription meds as directed and report if symptoms worsen or seek immediate care. Tizanidine prn prescribed today      Thrombocytosis    Likely reactive repeat cbc in 2-4 weeks to monitor      RESOLVED: Diarrhea     One to two loose stool daily, add fiber and probiotic and report worsening       I am having Benna Dunks maintain her etonogestrel, albuterol, venlafaxine XR, ciprofloxacin, nitrofurantoin (macrocrystal-monohydrate), sertraline, busPIRone, and polyethylene glycol.  Meds ordered this encounter  Medications   DISCONTD: tiZANidine (ZANAFLEX) 2 MG tablet    Sig: Take 0.5-2 tablets (1-4 mg total) by mouth 2 (two) times daily as needed for muscle spasms.    Dispense:  30 tablet    Refill:  1

## 2021-10-17 ENCOUNTER — Encounter: Payer: Self-pay | Admitting: Family Medicine

## 2021-10-17 ENCOUNTER — Other Ambulatory Visit: Payer: Self-pay | Admitting: Family Medicine

## 2021-10-17 ENCOUNTER — Ambulatory Visit (INDEPENDENT_AMBULATORY_CARE_PROVIDER_SITE_OTHER): Payer: Medicaid Other | Admitting: Family Medicine

## 2021-10-17 VITALS — BP 106/80 | HR 88 | Resp 20 | Ht 63.0 in | Wt 176.0 lb

## 2021-10-17 DIAGNOSIS — M5441 Lumbago with sciatica, right side: Secondary | ICD-10-CM | POA: Diagnosis not present

## 2021-10-17 DIAGNOSIS — N390 Urinary tract infection, site not specified: Secondary | ICD-10-CM

## 2021-10-17 DIAGNOSIS — R55 Syncope and collapse: Secondary | ICD-10-CM | POA: Insufficient documentation

## 2021-10-17 DIAGNOSIS — N12 Tubulo-interstitial nephritis, not specified as acute or chronic: Secondary | ICD-10-CM

## 2021-10-17 DIAGNOSIS — D75839 Thrombocytosis, unspecified: Secondary | ICD-10-CM

## 2021-10-17 DIAGNOSIS — M545 Low back pain, unspecified: Secondary | ICD-10-CM

## 2021-10-17 DIAGNOSIS — R197 Diarrhea, unspecified: Secondary | ICD-10-CM | POA: Insufficient documentation

## 2021-10-17 DIAGNOSIS — F411 Generalized anxiety disorder: Secondary | ICD-10-CM

## 2021-10-17 DIAGNOSIS — R739 Hyperglycemia, unspecified: Secondary | ICD-10-CM

## 2021-10-17 HISTORY — DX: Low back pain, unspecified: M54.50

## 2021-10-17 HISTORY — DX: Syncope and collapse: R55

## 2021-10-17 HISTORY — DX: Hyperglycemia, unspecified: R73.9

## 2021-10-17 HISTORY — DX: Urinary tract infection, site not specified: N39.0

## 2021-10-17 HISTORY — DX: Thrombocytosis, unspecified: D75.839

## 2021-10-17 LAB — CBC WITH DIFFERENTIAL/PLATELET
Basophils Absolute: 0 10*3/uL (ref 0.0–0.1)
Basophils Relative: 0.6 % (ref 0.0–3.0)
Eosinophils Absolute: 0.1 10*3/uL (ref 0.0–0.7)
Eosinophils Relative: 1.5 % (ref 0.0–5.0)
HCT: 40.7 % (ref 36.0–46.0)
Hemoglobin: 13.7 g/dL (ref 12.0–15.0)
Lymphocytes Relative: 41.7 % (ref 12.0–46.0)
Lymphs Abs: 2.2 10*3/uL (ref 0.7–4.0)
MCHC: 33.6 g/dL (ref 30.0–36.0)
MCV: 87.8 fl (ref 78.0–100.0)
Monocytes Absolute: 0.3 10*3/uL (ref 0.1–1.0)
Monocytes Relative: 6.1 % (ref 3.0–12.0)
Neutro Abs: 2.6 10*3/uL (ref 1.4–7.7)
Neutrophils Relative %: 50.1 % (ref 43.0–77.0)
Platelets: 577 10*3/uL — ABNORMAL HIGH (ref 150.0–400.0)
RBC: 4.63 Mil/uL (ref 3.87–5.11)
RDW: 13.5 % (ref 11.5–14.6)
WBC: 5.2 10*3/uL (ref 4.5–10.5)

## 2021-10-17 LAB — COMPREHENSIVE METABOLIC PANEL
ALT: 25 U/L (ref 0–35)
AST: 15 U/L (ref 0–37)
Albumin: 4.4 g/dL (ref 3.5–5.2)
Alkaline Phosphatase: 62 U/L (ref 39–117)
BUN: 11 mg/dL (ref 6–23)
CO2: 26 mEq/L (ref 19–32)
Calcium: 9.9 mg/dL (ref 8.4–10.5)
Chloride: 104 mEq/L (ref 96–112)
Creatinine, Ser: 0.84 mg/dL (ref 0.40–1.20)
GFR: 99.86 mL/min (ref >60.00)
Glucose, Bld: 80 mg/dL (ref 70–99)
Potassium: 4.6 mEq/L (ref 3.5–5.1)
Sodium: 138 mEq/L (ref 135–145)
Total Bilirubin: 0.5 mg/dL (ref 0.2–1.2)
Total Protein: 7.4 g/dL (ref 6.0–8.3)

## 2021-10-17 LAB — URINALYSIS
Bilirubin Urine: NEGATIVE
Ketones, ur: NEGATIVE
Leukocytes,Ua: NEGATIVE
Nitrite: NEGATIVE
Specific Gravity, Urine: 1.025 (ref 1.000–1.030)
Total Protein, Urine: NEGATIVE
Urine Glucose: NEGATIVE
Urobilinogen, UA: 0.2 (ref 0.0–1.0)
pH: 6 (ref 5.0–8.0)

## 2021-10-17 LAB — HEMOGLOBIN A1C: Hgb A1c MFr Bld: 5.3 % (ref 4.6–6.5)

## 2021-10-17 LAB — SEDIMENTATION RATE: Sed Rate: 17 mm/hr (ref 0–20)

## 2021-10-17 LAB — TSH: TSH: 1.27 u[IU]/mL (ref 0.35–5.50)

## 2021-10-17 MED ORDER — TIZANIDINE HCL 2 MG PO TABS: 1.0000 mg | ORAL_TABLET | Freq: Two times a day (BID) | ORAL | 1 refills | Status: DC | PRN

## 2021-10-17 MED ORDER — CYCLOBENZAPRINE HCL 5 MG PO TABS: 5.0000 mg | ORAL_TABLET | Freq: Two times a day (BID) | ORAL | 0 refills | Status: DC | PRN

## 2021-10-17 NOTE — Assessment & Plan Note (Signed)
Likely reactive repeat cbc in 2-4 weeks to monitor

## 2021-10-17 NOTE — Telephone Encounter (Signed)
Per pharmacy- conflict w/ other medication (Cipro)

## 2021-10-17 NOTE — Assessment & Plan Note (Signed)
Much better on combination of meds no changes

## 2021-10-17 NOTE — Patient Instructions (Signed)
NOW probiotic Benefiber or gummies  60-80 ounces fluids Protein every 4 hours,  Gatorade, small amounts several times a day  Postural Orthostatic Tachycardia Syndrome Postural orthostatic tachycardia syndrome (POTS) is a group of symptoms that occur along with an increase in heart rate when a person stands up after lying down. The symptoms include light-headedness or fainting, and they improve when the person lies back down. POTS may be associated with another medical condition, or it may occur on its own. What are the causes? The cause of this condition is not known, but many conditions and diseases are associated with it. What increases the risk? This condition is more likely to develop in: Women 69-40 years old. Women who are pregnant. Women who are in their period (menstruating). People who have certain conditions, such as: Infection from a virus. Diseases that cause the body's defense system (immune system) to attack healthy organs. These are called autoimmune diseases. Losing a lot of red blood cells (anemia). Losing too much water in the body (dehydration). An overactive thyroid (hyperthyroidism). People who take certain medicines. People who have had a major injury. People who have had surgery. What are the signs or symptoms? The most common symptom of this condition is light-headedness when you stand up from a lying or sitting position. Other symptoms may include: Feeling a rapid increase in the heartbeat (tachycardia) within 10 minutes of standing up. Chest pain. Shortness of breath. Breathing that is deeper and faster than normal (hyperventilation). Fainting. Confusion. Trembling. Weakness. Headache. Anxiety. Nausea. Sweating or flushing. Symptoms may be worse in the morning, and they may be relieved by lying down. How is this diagnosed? This condition is diagnosed based on: Your symptoms. Your medical history. A physical exam. Checking your heart rate when you  are lying down and after you stand up. Checking your blood pressure when you go from lying down to standing up. Blood and urine tests to measure hormones that change with blood pressure. The blood tests will be done when you are lying down and when you are standing up. You may have other tests to check for conditions or diseases that are associated with POTS. How is this treated? Treatment for this condition depends on how severe your symptoms are and whether you have any conditions or diseases that are associated with POTS. Treatment may involve: Treating any conditions or diseases that are associated with POTS. Drinking two glasses of water before getting up from a lying position. Increasing salt (sodium) in your diet. Taking medicine to control blood pressure and heart rate (beta-blocker). Avoiding certain medicines. Starting an exercise program under the supervision of a health care provider. Follow these instructions at home: Medicines Take over-the-counter and prescription medicines only as told by your health care provider. Let your health care provider know about all prescription or over-the-counter medicines you take. These include herbs, vitamins, and supplements. You may need to stop or adjust some medicines if they cause this condition. Talk with your health care provider before starting any new medicines. Eating and drinking  Drink enough fluid to keep your urine pale yellow. If told by your health care provider, drink two glasses of water before getting up from a lying position. Follow instructions from your health care provider about how much sodium you should include in your diet. Avoid heavy meals. Eat several small meals a day instead of a few large meals. General instructions Do an aerobic exercise for 20 minutes a day, at least 3 days a week. Aerobic  exercises are those that cause your heart to beat faster. Ask your health care provider what kinds of exercise are safe for  you. Do not use any products that contain nicotine or tobacco. These products include cigarettes, chewing tobacco, and vaping devices, such as e-cigarettes. These can interfere with blood flow. If you need help quitting, ask your health care provider. Keep all follow-up visits. This is important. Contact a health care provider if: Your symptoms do not improve after treatment. Your symptoms get worse. You develop new symptoms. Get help right away if: You have chest pain. You have difficulty breathing. You have fainting episodes. These symptoms may be an emergency. Get help right away. Call 911. Do not wait to see if the symptoms will go away. Do not drive yourself to the hospital. Summary POTS is a group of symptoms that occur along with an increase in heart rate when a person stands up after lying down. The most common symptom is light-headedness when you stand up. Treatment for this condition includes treating any underlying conditions, drinking plenty of water, stopping or changing some medicines, or starting an exercise program. Get help right away if you have chest pain, difficulty breathing, or fainting episodes. These symptoms may be an emergency. This information is not intended to replace advice given to you by your health care provider. Make sure you discuss any questions you have with your health care provider. Document Revised: 10/25/2020 Document Reviewed: 10/25/2020 Elsevier Patient Education  2023 [employer].

## 2021-10-17 NOTE — Assessment & Plan Note (Signed)
One to two loose stool daily, add fiber and probiotic and report worsening

## 2021-10-17 NOTE — Assessment & Plan Note (Addendum)
Had an episode the day before she was hospitalized and then she passed out again the day she was hospitalized. Was OK til yesterday when she had two presyncopal episodes. She has also had some hot flashes yesterday worried about persistent infection, check blood and urine today consider changing antibiotic and she will let us know if she has worse symptoms.

## 2021-10-17 NOTE — Assessment & Plan Note (Signed)
Check hgba1c today.  

## 2021-10-17 NOTE — Assessment & Plan Note (Signed)
Intermittent better today. Encouraged moist heat and gentle stretching as tolerated. May try NSAIDs and prescription meds as directed and report if symptoms worsen or seek immediate care. Tizanidine prn prescribed today

## 2021-10-17 NOTE — Assessment & Plan Note (Addendum)
With recent episode of sepsis and hospitalization. Has been referred to urology for further consideration but has not seen them yet. Start a probiotic and a fiber supplement, hydrate well and report worsening symptoms

## 2021-10-18 LAB — URINE CULTURE
MICRO NUMBER:: 13559401
Result:: NO GROWTH
SPECIMEN QUALITY:: ADEQUATE

## 2021-10-21 DIAGNOSIS — N12 Tubulo-interstitial nephritis, not specified as acute or chronic: Secondary | ICD-10-CM | POA: Diagnosis not present

## 2021-10-21 DIAGNOSIS — Z87440 Personal history of urinary (tract) infections: Secondary | ICD-10-CM | POA: Diagnosis not present

## 2021-10-31 DIAGNOSIS — Z87440 Personal history of urinary (tract) infections: Secondary | ICD-10-CM | POA: Diagnosis not present

## 2021-10-31 DIAGNOSIS — N12 Tubulo-interstitial nephritis, not specified as acute or chronic: Secondary | ICD-10-CM | POA: Diagnosis not present

## 2021-10-31 DIAGNOSIS — N10 Acute tubulo-interstitial nephritis: Secondary | ICD-10-CM | POA: Diagnosis not present

## 2021-10-31 DIAGNOSIS — Z435 Encounter for attention to cystostomy: Secondary | ICD-10-CM | POA: Diagnosis not present

## 2021-12-07 NOTE — Progress Notes (Deleted)
[facility] at [facility] [address] [place], [place] [address] [phone]  Date:  12/09/2021   Name:  Cindy Townsend   DOB:  06/12/2000   MRN:  [id number]  PCP:  Bradd Canary, MD    Chief Complaint: No chief complaint on file.   History of Present Illness:  Cindy Townsend is a 21 y.o. very pleasant female patient who presents with the following:  Patient seen today for short-term follow-up-her virtually about a year ago for concern of anxiety  She was seen by Dr. Abner Greenspan in June with pyelonephritis, followed up with Dr. Sabino Gasser, urology in the interim.  She was admitted with illness due to pyelonephritis at [facility] in June  Patient Active Problem List   Diagnosis Date Noted   UTI (urinary tract infection) 10/17/2021   Syncope 10/17/2021   Hyperglycemia 10/17/2021   Low back pain 10/17/2021   Thrombocytosis 10/17/2021   Pyelonephritis 06/26/2021   Headache 05/27/2015   Knee pain 09/24/2014   Anxiety state 07/24/2014   Allergic rhinitis 06/26/2014   Abdominal pain 02/27/2014   Encounter for repeat prescription of oral contraceptives 01/24/2014   Urticaria 03/01/2012   Preventative health care 02/22/2012   ADD (attention deficit disorder) 02/22/2012   Asthma    Eczema     Past Medical History:  Diagnosis Date   ADHD (attention deficit hyperactivity disorder)    ADHD   Anxiety state 07/24/2014   Asthma    triggered by exercise and URI; prn inhaler   Chronic otitis media 05/2011    Past Surgical History:  Procedure Laterality Date   TONSILLECTOMY     TYMPANOSTOMY TUBE PLACEMENT  2012    Social History   Tobacco Use   Smoking status: Never    Passive exposure: Never   Smokeless tobacco: Never  Vaping Use   Vaping Use: Never used  Substance Use Topics   Alcohol use: No   Drug use: No    Family History  Problem Relation Age of Onset   Asthma Mother    Anesthesia problems Mother         post-op nausea   Hypertension Maternal Grandmother    Asthma Maternal Grandmother    Diabetes Paternal Grandmother        type 2- controlled by diet    Allergies  Allergen Reactions   Ciprofloxacin Itching   Penicillins Hives    Has patient had a PCN reaction causing immediate rash, facial/tongue/throat swelling, SOB or lightheadedness with hypotension: yes Has patient had a PCN reaction causing severe rash involving mucus membranes or skin necrosis: no Has patient had a PCN reaction that required hospitalization no Has patient had a PCN reaction occurring within the last 10 years: yes If all of the above answers are "NO", then may proceed with Cephalosporin use.   Soap Rash    Has to use sensitive skin products    Medication list has been reviewed and updated.  Current Outpatient Medications on File Prior to Visit  Medication Sig Dispense Refill   albuterol (PROAIR HFA) 108 (90 Base) MCG/ACT inhaler Inhale 1-2 puffs into the lungs every 6 (six) hours as needed for wheezing or shortness of breath. 8 g 0   busPIRone (BUSPAR) 7.5 MG tablet Take 7.5 mg by mouth 2 (two) times daily.     ciprofloxacin (CIPRO) 500 MG tablet Take 500 mg by mouth 2 (two) times daily.  cyclobenzaprine (FLEXERIL) 5 MG tablet Take 1 tablet (5 mg total) by mouth 2 (two) times daily as needed for muscle spasms. 30 tablet 0   etonogestrel (NEXPLANON) 68 MG IMPL implant 1 each by Subdermal route once.     nitrofurantoin, macrocrystal-monohydrate, (MACROBID) 100 MG capsule Take 100 mg by mouth daily. (Patient not taking: Reported on 10/17/2021)     polyethylene glycol (MIRALAX / GLYCOLAX) 17 g packet Take by mouth.     sertraline (ZOLOFT) 50 MG tablet Take 1 tablet by mouth daily.     venlafaxine XR (EFFEXOR XR) 75 MG 24 hr capsule Take 1 capsule (75 mg total) by mouth daily with breakfast. 30 capsule 3   No current facility-administered medications on file prior to visit.    Review of Systems:  As  per HPI- otherwise negative.   Physical Examination: There were no vitals filed for this visit. There were no vitals filed for this visit. There is no height or weight on file to calculate BMI. Ideal Body Weight:    GEN: no acute distress. HEENT: Atraumatic, Normocephalic.  Ears and Nose: No external deformity. CV: RRR, No M/G/R. No JVD. No thrill. No extra heart sounds. PULM: CTA B, no wheezes, crackles, rhonchi. No retractions. No resp. distress. No accessory muscle use. ABD: S, NT, ND, +BS. No rebound. No HSM. EXTR: No c/c/e PSYCH: Normally interactive. Conversant.    Assessment and Plan: ***  Signed Abbe Amsterdam, MD

## 2021-12-09 ENCOUNTER — Ambulatory Visit: Payer: Medicaid Other | Admitting: Family Medicine

## 2021-12-12 NOTE — Progress Notes (Deleted)
[facility] at [facility] [address], [address] [place], [place] [address] [phone]  Date:  12/16/2021   Name:  Cindy Townsend   DOB:  [birth year]   MRN:  [id number]  PCP:  Bradd Canary, MD    Chief Complaint: No chief complaint on file.   History of Present Illness:  Cindy Townsend is a 21 y.o. very pleasant female patient who presents with the following:  Patient seen today for short-term follow-up She was seen by her PCP, Dr. Abner Greenspan in the office on June 22 with concern of pyelonephritis She has since followed up with urology  Year ago for anxiety Patient Active Problem List   Diagnosis Date Noted   UTI (urinary tract infection) 10/17/2021   Syncope 10/17/2021   Hyperglycemia 10/17/2021   Low back pain 10/17/2021   Thrombocytosis 10/17/2021   Pyelonephritis 06/26/2021   Headache 05/27/2015   Knee pain 09/24/2014   Anxiety state 07/24/2014   Allergic rhinitis 06/26/2014   Abdominal pain 02/27/2014   Encounter for repeat prescription of oral contraceptives 01/24/2014   Urticaria 03/01/2012   Preventative health care 02/22/2012   ADD (attention deficit disorder) 02/22/2012   Asthma    Eczema     Past Medical History:  Diagnosis Date   ADHD (attention deficit hyperactivity disorder)    ADHD   Anxiety state 07/24/2014   Asthma    triggered by exercise and URI; prn inhaler   Chronic otitis media 05/2011    Past Surgical History:  Procedure Laterality Date   TONSILLECTOMY     TYMPANOSTOMY TUBE PLACEMENT  2012    Social History   Tobacco Use   Smoking status: Never    Passive exposure: Never   Smokeless tobacco: Never  Vaping Use   Vaping Use: Never used  Substance Use Topics   Alcohol use: No   Drug use: No    Family History  Problem Relation Age of Onset   Asthma Mother    Anesthesia problems Mother        post-op nausea   Hypertension Maternal Grandmother    Asthma Maternal Grandmother     Diabetes Paternal Grandmother        type 2- controlled by diet    Allergies  Allergen Reactions   Ciprofloxacin Itching   Penicillins Hives    Has patient had a PCN reaction causing immediate rash, facial/tongue/throat swelling, SOB or lightheadedness with hypotension: yes Has patient had a PCN reaction causing severe rash involving mucus membranes or skin necrosis: no Has patient had a PCN reaction that required hospitalization no Has patient had a PCN reaction occurring within the last 10 years: yes If all of the above answers are "NO", then may proceed with Cephalosporin use.   Soap Rash    Has to use sensitive skin products    Medication list has been reviewed and updated.  Current Outpatient Medications on File Prior to Visit  Medication Sig Dispense Refill   albuterol (PROAIR HFA) 108 (90 Base) MCG/ACT inhaler Inhale 1-2 puffs into the lungs every 6 (six) hours as needed for wheezing or shortness of breath. 8 g 0   busPIRone (BUSPAR) 7.5 MG tablet Take 7.5 mg by mouth 2 (two) times daily.     ciprofloxacin (CIPRO) 500 MG tablet Take 500 mg by mouth 2 (two) times daily.     cyclobenzaprine (FLEXERIL) 5 MG tablet Take 1 tablet (5 mg total) by mouth 2 (two)  times daily as needed for muscle spasms. 30 tablet 0   etonogestrel (NEXPLANON) 68 MG IMPL implant 1 each by Subdermal route once.     nitrofurantoin, macrocrystal-monohydrate, (MACROBID) 100 MG capsule Take 100 mg by mouth daily. (Patient not taking: Reported on 10/17/2021)     polyethylene glycol (MIRALAX / GLYCOLAX) 17 g packet Take by mouth.     sertraline (ZOLOFT) 50 MG tablet Take 1 tablet by mouth daily.     venlafaxine XR (EFFEXOR XR) 75 MG 24 hr capsule Take 1 capsule (75 mg total) by mouth daily with breakfast. 30 capsule 3   No current facility-administered medications on file prior to visit.    Review of Systems:  As per HPI- otherwise negative.   Physical Examination: There were no vitals filed for this  visit. There were no vitals filed for this visit. There is no height or weight on file to calculate BMI. Ideal Body Weight:    GEN: no acute distress. HEENT: Atraumatic, Normocephalic.  Ears and Nose: No external deformity. CV: RRR, No M/G/R. No JVD. No thrill. No extra heart sounds. PULM: CTA B, no wheezes, crackles, rhonchi. No retractions. No resp. distress. No accessory muscle use. ABD: S, NT, ND, +BS. No rebound. No HSM. EXTR: No c/c/e PSYCH: Normally interactive. Conversant.    Assessment and Plan: ***  Signed Abbe Amsterdam, MD

## 2021-12-16 ENCOUNTER — Ambulatory Visit: Payer: Medicaid Other | Admitting: Family Medicine

## 2021-12-31 ENCOUNTER — Telehealth: Payer: Self-pay

## 2021-12-31 ENCOUNTER — Ambulatory Visit: Payer: Medicaid Other | Admitting: Family Medicine

## 2021-12-31 DIAGNOSIS — Z3202 Encounter for pregnancy test, result negative: Secondary | ICD-10-CM | POA: Diagnosis not present

## 2021-12-31 DIAGNOSIS — R9431 Abnormal electrocardiogram [ECG] [EKG]: Secondary | ICD-10-CM | POA: Diagnosis not present

## 2021-12-31 DIAGNOSIS — R55 Syncope and collapse: Secondary | ICD-10-CM | POA: Diagnosis not present

## 2021-12-31 NOTE — Telephone Encounter (Signed)
Called pt and stated her mom take to ER

## 2021-12-31 NOTE — Telephone Encounter (Signed)
Nurse Assessment Nurse: Stefano Gaul, RN, Dwana Curd Date/Time (Eastern Time): 12/31/2021 10:04:19 AM Confirm and document reason for call. If symptomatic, describe symptoms. ---Caller states she is passing out 2-3 times daily. Taking medication for anxiety/depression. has not passed out today but 5 times yesterday. Does the patient have any new or worsening symptoms? ---Yes Will a triage be completed? ---Yes Related visit to physician within the last 2 weeks? ---No Does the PT have any chronic conditions? (i.e. diabetes, asthma, this includes High risk factors for pregnancy, etc.) ---Yes List chronic conditions. ---anxiety; depression Is the patient pregnant or possibly pregnant? (Ask all females between the ages of 26-55) ---No Is this a behavioral health or substance abuse call? ---No Guidelines Guideline Title Affirmed Question Affirmed Notes Nurse Date/Time (Eastern Time) Fainting Extra heartbeats, irregular heart beating, or heart is beating very fast (i.e., "palpitations") Stefano Gaul, RN, Dwana Curd 12/31/2021 10:06:27 AM PLEASE NOTE: All timestamps contained within this report are represented as [place] Standard Time. CONFIDENTIALTY NOTICE: This fax transmission is intended only for the addressee. It contains information that is legally privileged, confidential or otherwise protected from use or disclosure. If you are not the intended recipient, you are strictly prohibited from reviewing, disclosing, copying using or disseminating any of this information or taking any action in reliance on or regarding this information. If you have received this fax in error, please notify us immediately by telephone so that we can arrange for its return to [place]. Phone: [phone], Toll-Free: [phone], Fax: [phone] Page: 2 of 2 Call Id: [id number] Disp. Time [name] Time) Disposition Final User 12/31/2021 10:01:40 AM Send to Urgent [name] 12/31/2021 10:08:44 AM Call EMS 911 Now Yes  Stefano Gaul, RN, Dwana Curd 12/31/2021 10:09:50 AM Send To RN Personal Stefano Gaul, RN, [name] 12/31/2021 10:15:03 AM 911 Outcome Documentation [name], RN, Dwana Curd Reason: attempted 911 outcome documentation call and she states her mother is going to take her to the ER Final Disposition 12/31/2021 10:08:44 AM Call EMS 911 Now Yes Stefano Gaul, RN, [name] Disagree/Comply Disagree Caller Understands Yes PreDisposition Did not know what to do Care Advice Given Per Guideline CALL EMS 911 NOW: CARE ADVICE given per Fainting (Adult) guideline. * Immediate medical attention is needed. You need to hang up and call 911 (or an ambulance). Comments User: [name], RN Date/Time [name] Time): 12/31/2021 10:09:29 AM pt is not going to call 911 but will have her mother take her to the ER. pt was septic back in June Referrals [facility] - ED

## 2022-01-02 ENCOUNTER — Telehealth: Payer: Self-pay | Admitting: Family Medicine

## 2022-01-02 NOTE — Telephone Encounter (Signed)
Pt was called based on symptoms listed in appt made via MyChart. Pt stated she was experiencing the following symptoms:  -Dizziness accompanied by shakiness when she wakes up and a few times during the day  -Passed out at least once, unsure how long she was out for  Pt was seen at the ED for this issue on 9.5.23. Pt was transferred to triage nurse for further eval.

## 2022-01-02 NOTE — Telephone Encounter (Signed)
Nurse Assessment Nurse: Mayford Knife, RN, Lupe Carney Date/Time (Eastern Time): 01/02/2022 8:52:37 AM Confirm and document reason for call. If symptomatic, describe symptoms. ---Has been having episodes of fainting was seen in ED and told "it was normal for her age". Still feeling bad, no energy. Does the patient have any new or worsening symptoms? ---Yes Will a triage be completed? ---Yes Related visit to physician within the last 2 weeks? ---Yes Does the PT have any chronic conditions? (i.e. diabetes, asthma, this includes High risk factors for pregnancy, etc.) ---Yes List chronic conditions. ---anx/dep Is the patient pregnant or possibly pregnant? (Ask all females between the ages of 79-55) ---No Is this a behavioral health or substance abuse call? ---No Guidelines Guideline Title Affirmed Question Affirmed Notes Nurse Date/Time (Eastern Time) Weakness (Generalized) and Fatigue Heart beating < 50 beats per minute OR > 140 beats per minute Turner, RN, Rebekah 01/02/2022 8:55:35 AM PLEASE NOTE: All timestamps contained within this report are represented as [place] Standard Time. CONFIDENTIALTY NOTICE: This fax transmission is intended only for the addressee. It contains information that is legally privileged, confidential or otherwise protected from use or disclosure. If you are not the intended recipient, you are strictly prohibited from reviewing, disclosing, copying using or disseminating any of this information or taking any action in reliance on or regarding this information. If you have received this fax in error, please notify us immediately by telephone so that we can arrange for its return to [place]. Phone: [phone], Toll-Free: [phone], Fax: [phone] Page: 2 of 2 Call Id: [id number] Disp. Time [name] Time) Disposition Final User 01/02/2022 8:47:40 AM Send to Urgent Marrian Salvage, Ashely 01/02/2022 9:00:46 AM Go to ED Now Yes Mayford Knife, RN, Lupe Carney Final Disposition 01/02/2022  9:00:46 AM Go to ED Now Yes Mayford Knife, RN, Debroah Loop Disagree/Comply Comply Caller Understands Yes PreDisposition Call Doctor Care Advice Given Per Guideline GO TO ED NOW: * You need to be seen in the Emergency Department. CARE ADVICE given per Weakness and Fatigue (Adult) guideline. Referrals [facility] - ED

## 2022-01-02 NOTE — Telephone Encounter (Signed)
Pt seen at ED on 12/31/21 at [facility], she continues w/ syncopal episodes and has been re-triaged to ED again today.

## 2022-01-03 ENCOUNTER — Other Ambulatory Visit: Payer: Self-pay | Admitting: Family Medicine

## 2022-01-03 DIAGNOSIS — R55 Syncope and collapse: Secondary | ICD-10-CM

## 2022-01-03 NOTE — Telephone Encounter (Signed)
Called pt was advised and pt agree to referral

## 2022-01-11 NOTE — Progress Notes (Deleted)
[facility] [address] [place], [place] [address] [phone]  Date:  01/15/2022   Name:  Cindy Townsend   DOB:  Aug 16, 2000   MRN:  [id number]  PCP:  Bradd Canary, MD    Chief Complaint: No chief complaint on file.   History of Present Illness:  Cindy Townsend is a 21 y.o. very pleasant female patient who presents with the following:  Pt seen today with concern of dizziness Last seen by myself virtually about a year ago She was seen in the ER on 9/5-  Medical Decision Making Patient is a 21 year old female with history of depression, anxiety, recent admission for bilateral pyelonephritis, presents for evaluation of syncopal episode. She states she has been feeling a little unwell for the last few days. She has had a little bit of fatigue, right lower back pain, nausea. Yesterday evening, she began feeling lightheaded and then briefly lost consciousness. She had tunnel vision just prior to this episode occurring. She denied any chest pain, palpitations, shortness of breath. She has not had any recent fever, chills, dysuria. No other respiratory symptoms. The loss of consciousness was brief and she returned to her baseline. There was no convulsive activity and she did not lose continence.  On exam, patient is very well-appearing. She has no focal deficits. She has no abdominal tenderness. No CVA tenderness. In fact, no tenderness throughout her entire spine.  Differential diagnosis includes vasovagal syncope, dehydration, renal dysfunction, cystitis, nephrolithiasis, pyelonephritis, other infection, dysrhythmia.  EKG shows sinus rhythm with no evidence of dysrhythmia. Intervals are normal. Labs obtained to evaluate for anemia, electrolyte abnormalities, renal dysfunction, cystitis, pyelonephritis. Her laboratory evaluation is very reassuring. Overall, she appears stable for discharge at this time. I suspect she had  a vasovagal episode last night. No signs of new or developing infection at this time. Plan to follow-up with her PCP. Return precautions discussed in detail.   Patient Active Problem List   Diagnosis Date Noted   UTI (urinary tract infection) 10/17/2021   Syncope 10/17/2021   Hyperglycemia 10/17/2021   Low back pain 10/17/2021   Thrombocytosis 10/17/2021   Pyelonephritis 06/26/2021   Headache 05/27/2015   Knee pain 09/24/2014   Anxiety state 07/24/2014   Allergic rhinitis 06/26/2014   Abdominal pain 02/27/2014   Encounter for repeat prescription of oral contraceptives 01/24/2014   Urticaria 03/01/2012   Preventative health care 02/22/2012   ADD (attention deficit disorder) 02/22/2012   Asthma    Eczema     Past Medical History:  Diagnosis Date   ADHD (attention deficit hyperactivity disorder)    ADHD   Anxiety state 07/24/2014   Asthma    triggered by exercise and URI; prn inhaler   Chronic otitis media 05/2011    Past Surgical History:  Procedure Laterality Date   TONSILLECTOMY     TYMPANOSTOMY TUBE PLACEMENT  2012    Social History   Tobacco Use   Smoking status: Never    Passive exposure: Never   Smokeless tobacco: Never  Vaping Use   Vaping Use: Never used  Substance Use Topics   Alcohol use: No   Drug use: No    Family History  Problem Relation Age of Onset   Asthma Mother    Anesthesia problems Mother        post-op nausea   Hypertension Maternal Grandmother    Asthma Maternal Grandmother    Diabetes Paternal Grandmother  type 2- controlled by diet    Allergies  Allergen Reactions   Ciprofloxacin Itching   Penicillins Hives    Has patient had a PCN reaction causing immediate rash, facial/tongue/throat swelling, SOB or lightheadedness with hypotension: yes Has patient had a PCN reaction causing severe rash involving mucus membranes or skin necrosis: no Has patient had a PCN reaction that required hospitalization no Has patient had a PCN  reaction occurring within the last 10 years: yes If all of the above answers are "NO", then may proceed with Cephalosporin use.   Soap Rash    Has to use sensitive skin products    Medication list has been reviewed and updated.  Current Outpatient Medications on File Prior to Visit  Medication Sig Dispense Refill   albuterol (PROAIR HFA) 108 (90 Base) MCG/ACT inhaler Inhale 1-2 puffs into the lungs every 6 (six) hours as needed for wheezing or shortness of breath. 8 g 0   busPIRone (BUSPAR) 7.5 MG tablet Take 7.5 mg by mouth 2 (two) times daily.     ciprofloxacin (CIPRO) 500 MG tablet Take 500 mg by mouth 2 (two) times daily.     cyclobenzaprine (FLEXERIL) 5 MG tablet Take 1 tablet (5 mg total) by mouth 2 (two) times daily as needed for muscle spasms. 30 tablet 0   etonogestrel (NEXPLANON) 68 MG IMPL implant 1 each by Subdermal route once.     nitrofurantoin, macrocrystal-monohydrate, (MACROBID) 100 MG capsule Take 100 mg by mouth daily. (Patient not taking: Reported on 10/17/2021)     polyethylene glycol (MIRALAX / GLYCOLAX) 17 g packet Take by mouth.     sertraline (ZOLOFT) 50 MG tablet Take 1 tablet by mouth daily.     venlafaxine XR (EFFEXOR XR) 75 MG 24 hr capsule Take 1 capsule (75 mg total) by mouth daily with breakfast. 30 capsule 3   No current facility-administered medications on file prior to visit.    Review of Systems:  As per HPI- otherwise negative.   Physical Examination: There were no vitals filed for this visit. There were no vitals filed for this visit. There is no height or weight on file to calculate BMI. Ideal Body Weight:    GEN: no acute distress. HEENT: Atraumatic, Normocephalic.  Ears and Nose: No external deformity. CV: RRR, No M/G/R. No JVD. No thrill. No extra heart sounds. PULM: CTA B, no wheezes, crackles, rhonchi. No retractions. No resp. distress. No accessory muscle use. ABD: S, NT, ND, +BS. No rebound. No HSM. EXTR: No c/c/e PSYCH: Normally  interactive. Conversant.    Assessment and Plan: ***  Signed Lamar Blinks, MD

## 2022-01-13 ENCOUNTER — Encounter: Payer: Self-pay | Admitting: Family Medicine

## 2022-01-14 ENCOUNTER — Telehealth: Payer: Medicaid Other | Admitting: Physician Assistant

## 2022-01-14 DIAGNOSIS — J069 Acute upper respiratory infection, unspecified: Secondary | ICD-10-CM

## 2022-01-14 MED ORDER — FLUTICASONE PROPIONATE 50 MCG/ACT NA SUSP: 2.0000 | Freq: Every day | NASAL | 0 refills | Status: DC

## 2022-01-14 MED ORDER — BENZONATATE 100 MG PO CAPS: 100.0000 mg | ORAL_CAPSULE | Freq: Three times a day (TID) | ORAL | 0 refills | Status: DC | PRN

## 2022-01-14 NOTE — Progress Notes (Signed)

## 2022-01-14 NOTE — Progress Notes (Signed)
I have spent 5 minutes in review of e-visit questionnaire, review and updating patient chart, medical decision making and response to patient.   Stefanie Hodgens Cody Payden Docter, PA-C    

## 2022-01-15 ENCOUNTER — Ambulatory Visit: Payer: Medicaid Other | Admitting: Family Medicine

## 2022-01-15 NOTE — Telephone Encounter (Signed)
Called pt and stated had E-visit they give her cough pearls medication and nasal spray  She is coughing up mucus

## 2022-01-16 ENCOUNTER — Encounter: Payer: Self-pay | Admitting: Family Medicine

## 2022-01-16 ENCOUNTER — Other Ambulatory Visit: Payer: Self-pay | Admitting: Family Medicine

## 2022-01-16 MED ORDER — AZITHROMYCIN 250 MG PO TABS: ORAL_TABLET | ORAL | 0 refills | Status: AC

## 2022-01-16 NOTE — Telephone Encounter (Signed)
Called pt was advised pt  Stated she understand

## 2022-02-17 DIAGNOSIS — R5383 Other fatigue: Secondary | ICD-10-CM | POA: Diagnosis not present

## 2022-02-17 DIAGNOSIS — Z6835 Body mass index (BMI) 35.0-35.9, adult: Secondary | ICD-10-CM | POA: Diagnosis not present

## 2022-02-17 DIAGNOSIS — Z5320 Procedure and treatment not carried out because of patient's decision for unspecified reasons: Secondary | ICD-10-CM | POA: Diagnosis not present

## 2022-02-17 DIAGNOSIS — F419 Anxiety disorder, unspecified: Secondary | ICD-10-CM | POA: Diagnosis not present

## 2022-02-17 DIAGNOSIS — F331 Major depressive disorder, recurrent, moderate: Secondary | ICD-10-CM | POA: Diagnosis not present

## 2022-02-17 DIAGNOSIS — Z3200 Encounter for pregnancy test, result unknown: Secondary | ICD-10-CM | POA: Diagnosis not present

## 2022-02-21 ENCOUNTER — Other Ambulatory Visit: Payer: Self-pay

## 2022-02-21 DIAGNOSIS — F909 Attention-deficit hyperactivity disorder, unspecified type: Secondary | ICD-10-CM | POA: Insufficient documentation

## 2022-03-05 ENCOUNTER — Ambulatory Visit: Payer: Medicaid Other | Attending: Cardiology | Admitting: Cardiology

## 2022-03-07 ENCOUNTER — Telehealth: Payer: Medicaid Other | Admitting: Physician Assistant

## 2022-03-07 DIAGNOSIS — J069 Acute upper respiratory infection, unspecified: Secondary | ICD-10-CM | POA: Diagnosis not present

## 2022-03-07 MED ORDER — PREDNISONE 20 MG PO TABS: 40.0000 mg | ORAL_TABLET | Freq: Every day | ORAL | 0 refills | Status: DC

## 2022-03-07 MED ORDER — AZELASTINE HCL 0.1 % NA SOLN: 1.0000 | Freq: Two times a day (BID) | NASAL | 0 refills | Status: DC

## 2022-03-07 MED ORDER — PROMETHAZINE-DM 6.25-15 MG/5ML PO SYRP: 5.0000 mL | ORAL_SOLUTION | Freq: Four times a day (QID) | ORAL | 0 refills | Status: DC | PRN

## 2022-03-07 NOTE — Progress Notes (Signed)
E-Visit for Upper Respiratory Infection   We are sorry you are not feeling well.  Here is how we plan to help!  Based on what you have shared with me, it looks like you may have a viral upper respiratory infection.  Upper respiratory infections are caused by a large number of viruses; however, rhinovirus is the most common cause.   Symptoms vary from person to person, with common symptoms including sore throat, cough, fatigue or lack of energy and feeling of general discomfort.  A low-grade fever of up to 100.4 may present, but is often uncommon.  Symptoms vary however, and are closely related to a person's age or underlying illnesses.  The most common symptoms associated with an upper respiratory infection are nasal discharge or congestion, cough, sneezing, headache and pressure in the ears and face.  These symptoms usually persist for about 3 to 10 days, but can last up to 2 weeks.  It is important to know that upper respiratory infections do not cause serious illness or complications in most cases.    Upper respiratory infections can be transmitted from person to person, with the most common method of transmission being a person's hands.  The virus is able to live on the skin and can infect other persons for up to 2 hours after direct contact.  Also, these can be transmitted when someone coughs or sneezes; thus, it is important to cover the mouth to reduce this risk.  To keep the spread of the illness at bay, good hand hygiene is very important.  This is an infection that is most likely caused by a virus. There are no specific treatments other than to help you with the symptoms until the infection runs its course.  We are sorry you are not feeling well.  Here is how we plan to help!   For nasal congestion, you may use an oral decongestants such as Mucinex D or if you have glaucoma or high blood pressure use plain Mucinex.  Saline nasal spray or nasal drops can help and can safely be used as often as  needed for congestion.  For your congestion, I have prescribed Azelastine nasal spray two sprays in each nostril twice a day  If you do not have a history of heart disease, hypertension, diabetes or thyroid disease, prostate/bladder issues or glaucoma, you may also use Sudafed to treat nasal congestion.  It is highly recommended that you consult with a pharmacist or your primary care physician to ensure this medication is safe for you to take.     If you have a cough, you may use cough suppressants such as Delsym and Robitussin.  If you have glaucoma or high blood pressure, you can also use Coricidin HBP.   For cough I have prescribed for you Promethazine DM cough syrup Take 69mL every 6 hours as needed for cough. I have also prescribed Prednisone 20mg  Take 2 tablets (40mg ) daily for 5 days.   If you have a sore or scratchy throat, use a saltwater gargle-  to  teaspoon of salt dissolved in a 4-ounce to 8-ounce glass of warm water.  Gargle the solution for approximately 15-30 seconds and then spit.  It is important not to swallow the solution.  You can also use throat lozenges/cough drops and Chloraseptic spray to help with throat pain or discomfort.  Warm or cold liquids can also be helpful in relieving throat pain.  For headache, pain or general discomfort, you can use Ibuprofen or Tylenol  as directed.   Some authorities believe that zinc sprays or the use of Echinacea may shorten the course of your symptoms.   HOME CARE Only take medications as instructed by your medical team. Be sure to drink plenty of fluids. Water is fine as well as fruit juices, sodas and electrolyte beverages. You may want to stay away from caffeine or alcohol. If you are nauseated, try taking small sips of liquids. How do you know if you are getting enough fluid? Your urine should be a pale yellow or almost colorless. Get rest. Taking a steamy shower or using a humidifier may help nasal congestion and ease sore throat pain.  You can place a towel over your head and breathe in the steam from hot water coming from a faucet. Using a saline nasal spray works much the same way. Cough drops, hard candies and sore throat lozenges may ease your cough. Avoid close contacts especially the very young and the elderly Cover your mouth if you cough or sneeze Always remember to wash your hands.   GET HELP RIGHT AWAY IF: You develop worsening fever. If your symptoms do not improve within 10 days You develop yellow or green discharge from your nose over 3 days. You have coughing fits You develop a severe head ache or visual changes. You develop shortness of breath, difficulty breathing or start having chest pain Your symptoms persist after you have completed your treatment plan  MAKE SURE YOU  Understand these instructions. Will watch your condition. Will get help right away if you are not doing well or get worse.  Thank you for choosing an e-visit.  Your e-visit answers were reviewed by a board certified advanced clinical practitioner to complete your personal care plan. Depending upon the condition, your plan could have included both over the counter or prescription medications.  Please review your pharmacy choice. Make sure the pharmacy is open so you can pick up prescription now. If there is a problem, you may contact your provider through [employer] and have the prescription routed to another pharmacy.  Your safety is important to [place]. If you have drug allergies check your prescription carefully.   For the next 24 hours you can use MyChart to ask questions about today's visit, request a non-urgent call back, or ask for a work or school excuse. You will get an email in the next two days asking about your experience. I hope that your e-visit has been valuable and will speed your recovery.   I have spent 5 minutes in review of e-visit questionnaire, review and updating patient chart, medical decision making and  response to patient.   Mar Daring, PA-C

## 2022-03-18 ENCOUNTER — Ambulatory Visit: Payer: Medicaid Other | Admitting: Family Medicine

## 2022-04-14 ENCOUNTER — Telehealth: Payer: Medicaid Other | Admitting: Family Medicine

## 2022-04-14 DIAGNOSIS — R6889 Other general symptoms and signs: Secondary | ICD-10-CM

## 2022-04-14 MED ORDER — OSELTAMIVIR PHOSPHATE 75 MG PO CAPS: 75.0000 mg | ORAL_CAPSULE | Freq: Two times a day (BID) | ORAL | 0 refills | Status: DC

## 2022-04-14 MED ORDER — PROMETHAZINE-DM 6.25-15 MG/5ML PO SYRP: 5.0000 mL | ORAL_SOLUTION | Freq: Four times a day (QID) | ORAL | 0 refills | Status: DC | PRN

## 2022-04-14 MED ORDER — AZITHROMYCIN 250 MG PO TABS: ORAL_TABLET | ORAL | 0 refills | Status: DC

## 2022-04-14 NOTE — Progress Notes (Signed)
E visit for Flu like symptoms   We are sorry that you are not feeling well.  Here is how we plan to help! Based on what you have shared with me it looks like you may have a respiratory virus that may be influenza.  Influenza or "the flu" is   an infection caused by a respiratory virus. The flu virus is highly contagious and persons who did not receive their yearly flu vaccination may "catch" the flu from close contact.  We have anti-viral medications to treat the viruses that cause this infection. They are not a "cure" and only shorten the course of the infection. These prescriptions are most effective when they are given within the first 2 days of "flu" symptoms. Antiviral medication are indicated if you have a high risk of complications from the flu. You should  also consider an antiviral medication if you are in close contact with someone who is at risk. These medications can help patients avoid complications from the flu  but have side effects that you should know. Possible side effects from Tamiflu or oseltamivir include nausea, vomiting, diarrhea, dizziness, headaches, eye redness, sleep problems or other respiratory symptoms. You should not take Tamiflu if you have an allergy to oseltamivir or any to the ingredients in Tamiflu.  Based upon your symptoms and potential risk factors I have prescribed Oseltamivir (Tamiflu).  It has been sent to your designated pharmacy.  You will take one 75 mg capsule orally twice a day for the next 5 days.  ANYONE WHO HAS FLU SYMPTOMS SHOULD: Stay home. The flu is highly contagious and going out or to work exposes others! Be sure to drink plenty of fluids. Water is fine as well as fruit juices, sodas and electrolyte beverages. You may want to stay away from caffeine or alcohol. If you are nauseated, try taking small sips of liquids. How do you know if you are getting enough fluid? Your urine should be a pale yellow or almost colorless. Get rest. Taking a steamy  shower or using a humidifier may help nasal congestion and ease sore throat pain. Using a saline nasal spray works much the same way. Cough drops, hard candies and sore throat lozenges may ease your cough. Line up a caregiver. Have someone check on you regularly.   GET HELP RIGHT AWAY IF: You cannot keep down liquids or your medications. You become short of breath Your fell like you are going to pass out or loose consciousness. Your symptoms persist after you have completed your treatment plan MAKE SURE YOU  Understand these instructions. Will watch your condition. Will get help right away if you are not doing well or get worse.  Your e-visit answers were reviewed by a board certified advanced clinical practitioner to complete your personal care plan.  Depending on the condition, your plan could have included both over the counter or prescription medications.  If there is a problem please reply  once you have received a response from your provider.  Your safety is important to us.  If you have drug allergies check your prescription carefully.    You can use MyChart to ask questions about today's visit, request a non-urgent call back, or ask for a work or school excuse for 24 hours related to this e-Visit. If it has been greater than 24 hours you will need to follow up with your provider, or enter a new e-Visit to address those concerns.  You will get an e-mail in the next   two days asking about your experience.  I hope that your e-visit has been valuable and will speed your recovery. Thank you for using e-visits.  I have spent 5 minutes in review of e-visit questionnaire, review and updating patient chart, medical decision making and response to patient.   Makhia Vosler M Hilliary Jock, PA-C  

## 2022-04-14 NOTE — Addendum Note (Signed)
Addended by: Margaretann Loveless on: 04/14/2022 11:13 AM   Modules accepted: Orders

## 2022-04-14 NOTE — Progress Notes (Signed)
We are sorry that you are not feeling well.  Here is how we plan to help!  Based on your presentation I believe you most likely have A cough due to bacteria.  When patients have a fever and a productive cough with a change in color or increased sputum production, we are concerned about bacterial bronchitis.  If left untreated it can progress to pneumonia.  If your symptoms do not improve with your treatment plan it is important that you contact your provider.   I have prescribed Azithromyin 250 mg: two tablets now and then one tablet daily for 4 additonal days    In addition you may use A non-prescription cough medication called Robitussin DAC. Take 2 teaspoons every 8 hours or Delsym: take 2 teaspoons every 12 hours. and A non-prescription cough medication called Mucinex DM: take 2 tablets every 12 hours.  From your responses in the eVisit questionnaire you describe inflammation in the upper respiratory tract which is causing a significant cough.  This is commonly called Bronchitis and has four common causes:   Allergies Viral Infections Acid Reflux Bacterial Infection Allergies, viruses and acid reflux are treated by controlling symptoms or eliminating the cause. An example might be a cough caused by taking certain blood pressure medications. You stop the cough by changing the medication. Another example might be a cough caused by acid reflux. Controlling the reflux helps control the cough.  USE OF BRONCHODILATOR ("RESCUE") INHALERS: There is a risk from using your bronchodilator too frequently.  The risk is that over-reliance on a medication which only relaxes the muscles surrounding the breathing tubes can reduce the effectiveness of medications prescribed to reduce swelling and congestion of the tubes themselves.  Although you feel brief relief from the bronchodilator inhaler, your asthma may actually be worsening with the tubes becoming more swollen and filled with mucus.  This can delay other  crucial treatments, such as oral steroid medications. If you need to use a bronchodilator inhaler daily, several times per day, you should discuss this with your provider.  There are probably better treatments that could be used to keep your asthma under control.     HOME CARE Only take medications as instructed by your medical team. Complete the entire course of an antibiotic. Drink plenty of fluids and get plenty of rest. Avoid close contacts especially the very young and the elderly Cover your mouth if you cough or cough into your sleeve. Always remember to wash your hands A steam or ultrasonic humidifier can help congestion.   GET HELP RIGHT AWAY IF: You develop worsening fever. You become short of breath You cough up blood. Your symptoms persist after you have completed your treatment plan MAKE SURE YOU  Understand these instructions. Will watch your condition. Will get help right away if you are not doing well or get worse.    Thank you for choosing an e-visit.  Your e-visit answers were reviewed by a board certified advanced clinical practitioner to complete your personal care plan. Depending upon the condition, your plan could have included both over the counter or prescription medications.  Please review your pharmacy choice. Make sure the pharmacy is open so you can pick up prescription now. If there is a problem, you may contact your provider through [employer] and have the prescription routed to another pharmacy.  Your safety is important to [place]. If you have drug allergies check your prescription carefully.   For the next 24 hours you can use MyChart  to ask questions about today's visit, request a non-urgent call back, or ask for a work or school excuse. You will get an email in the next two days asking about your experience. I hope that your e-visit has been valuable and will speed your recovery.  I have spent 5 minutes in review of e-visit questionnaire, review and  updating patient chart, medical decision making and response to patient.   Margaretann Loveless, PA-C

## 2022-04-14 NOTE — Addendum Note (Signed)
Addended by: Margaretann Loveless on: 04/14/2022 11:19 AM   Modules accepted: Orders

## 2022-04-30 ENCOUNTER — Ambulatory Visit (INDEPENDENT_AMBULATORY_CARE_PROVIDER_SITE_OTHER): Payer: Medicaid Other | Admitting: Medical

## 2022-04-30 VITALS — BP 100/60 | HR 56 | Temp 98.0°F | Resp 18 | Ht 63.0 in | Wt 209.6 lb

## 2022-04-30 DIAGNOSIS — R319 Hematuria, unspecified: Secondary | ICD-10-CM | POA: Diagnosis not present

## 2022-04-30 DIAGNOSIS — N3001 Acute cystitis with hematuria: Secondary | ICD-10-CM

## 2022-04-30 DIAGNOSIS — R109 Unspecified abdominal pain: Secondary | ICD-10-CM

## 2022-04-30 DIAGNOSIS — M549 Dorsalgia, unspecified: Secondary | ICD-10-CM | POA: Diagnosis not present

## 2022-04-30 LAB — POCT URINALYSIS DIPSTICK
Bilirubin, UA: NEGATIVE
Glucose, UA: NEGATIVE
Ketones, UA: NEGATIVE
Nitrite, UA: NEGATIVE
Protein, UA: NEGATIVE
Spec Grav, UA: 1.015 (ref 1.010–1.025)
Urobilinogen, UA: 0.2 E.U./dL
pH, UA: 6 (ref 5.0–8.0)

## 2022-04-30 MED ORDER — CIPROFLOXACIN HCL 500 MG PO TABS: 500.0000 mg | ORAL_TABLET | Freq: Two times a day (BID) | ORAL | 0 refills | Status: DC

## 2022-04-30 MED ORDER — PHENAZOPYRIDINE HCL 200 MG PO TABS: 200.0000 mg | ORAL_TABLET | Freq: Three times a day (TID) | ORAL | 0 refills | Status: DC | PRN

## 2022-04-30 NOTE — Progress Notes (Signed)
Subjective:    Patient ID: Cindy Townsend, female    DOB: 2001/03/01, 22 y.o.   MRN: [id number]  HPI   Pt in today reporting urinary symptoms. Pt states few months ago she had uti and became septic. Was admitted for 4 days.    LMP- started last Friday. Came on when expected. Her cycles are regular. On nexplanon  On review pt did see urologist post hostpicalization.   Plan: 1. Continue Macrobid daily for 30 days after completing Cipro 2. VCUG to rule out vesicoureteral reflux 3. RTC 1 month   History of Present Illness:  Cindy Townsend is a 22 y.o. year old female who presents for evaluation of recurrent pyelonephritis recently requiring ICU care at [facility].  She was admitted for clinical diagnosis of pyelonephritis which was supported by her CT scan findings. There is no evidence of obstruction of the urinary tract. Patient states that she has had urinary infection for the past 3 to 4 months. Prior to that she had 1 urinary tract infection. No other history of stones or urologic surgery. She lives with her grandmother in [place].  Her urine culture grew mostly antibiotic sensitive E. coli, resistant to Bactrim and ampicillin.Marland Kitchen She was discharged with a 14-day course of Cipro and then plans to transition to [facility] daily.  She denies any flank pain fever or chills. She has had some near syncopal episodes and plans to see her PCP regarding this. She is planning to resume her job next week if possible. "   Chief Complaint:  Chief Complaint  Patient presents with   Pyelonephritis bilateral     Dysuria- sometimes burns Frequent urination-yes Hesitancy-yes. Some decreased volume at time. Suprapubic pressure-no Fever-no chills-yes. Some possible sweats Nausea-no Vomiting-no CVA pain-mild rt cva pain.(Pt notes had rt side infection) History of UTI-yes Gross hematuria- none.  On review of pt has been on omnicef in past and received rocephin twice with no documented  reaction. Also urologist note states pt has been on cipro antibiotic just recently with no reaction. Though chart states allergy to cipro. Cipro allergy placed in chart 2016??   Review of Systems  Constitutional:  Negative for chills and fatigue.  Respiratory:  Negative for choking, shortness of breath and wheezing.   Cardiovascular:  Negative for chest pain and palpitations.  Gastrointestinal:  Negative for abdominal pain, blood in stool and constipation.  Genitourinary:  Positive for dysuria and frequency. Negative for decreased urine volume, hematuria, vaginal bleeding and vaginal pain.  Musculoskeletal:        Some rt side back pain reported.  Hematological:  Negative for adenopathy. Does not bruise/bleed easily.  Psychiatric/Behavioral:  Negative for decreased concentration and dysphoric mood.     Past Medical History:  Diagnosis Date   Abdominal pain 02/27/2014   ADD (attention deficit disorder) 02/22/2012   ADHD (attention deficit hyperactivity disorder)    ADHD   Allergic rhinitis 06/26/2014   Anxiety state 07/24/2014   Asthma    Chronic otitis media 05/2011   Eczema    arms   Encounter for repeat prescription of oral contraceptives 01/24/2014   Headache 05/27/2015   Hyperglycemia 10/17/2021   Knee pain 09/24/2014   Low back pain 10/17/2021   Preventative health care 02/22/2012   Pyelonephritis 06/26/2021   Syncope 10/17/2021   Thrombocytosis 10/17/2021   Urticaria 03/01/2012   UTI (urinary tract infection) 10/17/2021     Social History   Socioeconomic History   Marital status: Single  Spouse name: Not on file   Number of children: Not on file   Years of education: Not on file   Highest education level: Not on file  Occupational History   Not on file  Tobacco Use   Smoking status: Never    Passive exposure: Never   Smokeless tobacco: Never  Vaping Use   Vaping Use: Never used  Substance and Sexual Activity   Alcohol use: No   Drug use: No   Sexual activity: Yes     Birth control/protection: None  Other Topics Concern   Not on file  Social History Narrative   Not on file   Social Determinants of Health   Financial Resource Strain: Low Risk  (07/14/2018)   Overall Financial Resource Strain (CARDIA)    Difficulty of Paying Living Expenses: Not hard at all  Food Insecurity: No Food Insecurity (07/14/2018)   Hunger Vital Sign    Worried About Running Out of Food in the Last Year: Never true    [place] in the Last Year: Never true  Transportation Needs: Unknown (07/14/2018)   PRAPARE - Hydrologist (Medical): No    Lack of Transportation (Non-Medical): Not on file  Physical Activity: Not on file  Stress: Not on file  Social Connections: Not on file  Intimate Partner Violence: Not At Risk (07/14/2018)   Humiliation, Afraid, Rape, and Kick questionnaire    Fear of Current or Ex-Partner: No    Emotionally Abused: No    Physically Abused: No    Sexually Abused: No    Past Surgical History:  Procedure Laterality Date   TONSILLECTOMY     TYMPANOSTOMY TUBE PLACEMENT  2012    Family History  Problem Relation Age of Onset   Asthma Mother    Anesthesia problems Mother        post-op nausea   Hypertension Maternal Grandmother    Asthma Maternal Grandmother    Diabetes Paternal Grandmother        type 2- controlled by diet    Allergies  Allergen Reactions   Ciprofloxacin Itching   Penicillins Hives    Has patient had a PCN reaction causing immediate rash, facial/tongue/throat swelling, SOB or lightheadedness with hypotension: yes Has patient had a PCN reaction causing severe rash involving mucus membranes or skin necrosis: no Has patient had a PCN reaction that required hospitalization no Has patient had a PCN reaction occurring within the last 10 years: yes If all of the above answers are "NO", then may proceed with Cephalosporin use.   Soap Rash    Has to use sensitive skin products    Current  Outpatient Medications on File Prior to Visit  Medication Sig Dispense Refill   albuterol (PROAIR HFA) 108 (90 Base) MCG/ACT inhaler Inhale 1-2 puffs into the lungs every 6 (six) hours as needed for wheezing or shortness of breath. 8 g 0   azelastine (ASTELIN) 0.1 % nasal spray Place 1 spray into both nostrils 2 (two) times daily. Use in each nostril as directed 30 mL 0   busPIRone (BUSPAR) 7.5 MG tablet Take 7.5 mg by mouth 2 (two) times daily.     cyclobenzaprine (FLEXERIL) 5 MG tablet Take 1 tablet (5 mg total) by mouth 2 (two) times daily as needed for muscle spasms. 30 tablet 0   etonogestrel (NEXPLANON) 68 MG IMPL implant 1 each by Subdermal route once.     fluticasone (FLONASE) 50 MCG/ACT nasal spray Place 2  sprays into both nostrils daily. 16 g 0   oseltamivir (TAMIFLU) 75 MG capsule Take 1 capsule (75 mg total) by mouth 2 (two) times daily. 10 capsule 0   polyethylene glycol (MIRALAX / GLYCOLAX) 17 g packet Take 17 g by mouth as needed for constipation.     promethazine-dextromethorphan (PROMETHAZINE-DM) 6.25-15 MG/5ML syrup Take 5 mLs by mouth 4 (four) times daily as needed. 118 mL 0   sertraline (ZOLOFT) 50 MG tablet Take 1 tablet by mouth daily.     venlafaxine XR (EFFEXOR XR) 75 MG 24 hr capsule Take 1 capsule (75 mg total) by mouth daily with breakfast. 30 capsule 3   No current facility-administered medications on file prior to visit.    BP 100/60   Pulse (!) 56   Temp 98 F (36.7 C)   Resp 18   Ht 5\' 3"  (1.6 m)   Wt 209 lb 9.6 oz (95.1 kg)   LMP 04/25/2022   SpO2 100%   BMI 37.13 kg/m        Objective:   Physical Exam  General- No acute distress. Pleasant patient. Neck- Full range of motion, no jvd Lungs- Clear, even and unlabored. Heart- regular rate and rhythm. Neurologic- CNII- XII grossly intact.  Back- mild rt cva tendernss.  Abdomen-soft, nontender, nondistended, +bs, no rebound or guarding.      Assessment & Plan:   Patient Instructions  Recent  urinary tract infection signs and symptoms with some right-sided CVA tenderness.  On review ,history of pyelonephritis with hospitalization for sepsis in the past.  The urine culture back then showed some resistance.  On review you were placed on Macrobid after the hospitalization and were given Cipro antibiotic for 2 weeks.    On review the chart indicates that you had an allergy to Cipro in the past but when you took cipr recently you had no reaction whatsoever.  Do think based on your medical history and current symptoms stronger type antibiotic such as Cipro indicated.  Prescribing that for 7 days pending urine culture.  If you were to have any reaction as we discussed and have to advise emergency department evaluation.  Also worth noting penicillin allergy but appears that you had been able to tolerate cephalosporins.  Will get CBC, CMP and urine culture.  Hydrate well.  On review you did have history of punctate nephrolithiasis on prior CT in the past.  In light of your history of pyelonephritis I do think is best to go ahead and order CT renal stone protocol since you do have some blood presently as well as right side CVA area tenderness.-Study will need to be prior authorized.  Follow-up in 7 to 10 days or sooner if needed.   04/27/2022, PA-C

## 2022-04-30 NOTE — Patient Instructions (Signed)
Recent urinary tract infection signs and symptoms with some right-sided CVA tenderness.  On review ,history of pyelonephritis with hospitalization for sepsis in the past.  The urine culture back then showed some resistance.  On review you were placed on Macrobid after the hospitalization and were given Cipro antibiotic for 2 weeks.    On review the chart indicates that you had an allergy to Cipro in the past but when you took cipr recently you had no reaction whatsoever.  Do think based on your medical history and current symptoms stronger type antibiotic such as Cipro indicated.  Prescribing that for 7 days pending urine culture.  If you were to have any reaction as we discussed and have to advise emergency department evaluation.  Also worth noting penicillin allergy but appears that you had been able to tolerate cephalosporins.  Will get CBC, CMP and urine culture.  Hydrate well.  On review you did have history of punctate nephrolithiasis on prior CT in the past.  In light of your history of pyelonephritis I do think is best to go ahead and order CT renal stone protocol since you do have some blood presently as well as right side CVA area tenderness.-Study will need to be prior authorized.  Follow-up in 7 to 10 days or sooner if needed.

## 2022-05-01 ENCOUNTER — Ambulatory Visit (HOSPITAL_BASED_OUTPATIENT_CLINIC_OR_DEPARTMENT_OTHER): Admission: RE | Admit: 2022-05-01 | Payer: Medicaid Other | Source: Ambulatory Visit

## 2022-05-01 ENCOUNTER — Encounter: Payer: Self-pay | Admitting: Medical

## 2022-05-01 LAB — CBC WITH DIFFERENTIAL/PLATELET
Basophils Absolute: 0.1 10*3/uL (ref 0.0–0.1)
Basophils Relative: 0.8 % (ref 0.0–3.0)
Eosinophils Absolute: 0.1 10*3/uL (ref 0.0–0.7)
Eosinophils Relative: 1.7 % (ref 0.0–5.0)
HCT: 41.7 % (ref 36.0–46.0)
Hemoglobin: 13.9 g/dL (ref 12.0–15.0)
Lymphocytes Relative: 43.2 % (ref 12.0–46.0)
Lymphs Abs: 3 10*3/uL (ref 0.7–4.0)
MCHC: 33.4 g/dL (ref 30.0–36.0)
MCV: 89.7 fl (ref 78.0–100.0)
Monocytes Absolute: 0.4 10*3/uL (ref 0.1–1.0)
Monocytes Relative: 6.4 % (ref 3.0–12.0)
Neutro Abs: 3.3 10*3/uL (ref 1.4–7.7)
Neutrophils Relative %: 47.9 % (ref 43.0–77.0)
Platelets: 328 10*3/uL (ref 150.0–400.0)
RBC: 4.65 Mil/uL (ref 3.87–5.11)
RDW: 13.3 % (ref 11.5–15.5)
WBC: 7 10*3/uL (ref 4.0–10.5)

## 2022-05-01 LAB — COMPREHENSIVE METABOLIC PANEL
ALT: 19 U/L (ref 0–35)
AST: 15 U/L (ref 0–37)
Albumin: 4.4 g/dL (ref 3.5–5.2)
Alkaline Phosphatase: 52 U/L (ref 39–117)
BUN: 10 mg/dL (ref 6–23)
CO2: 26 mEq/L (ref 19–32)
Calcium: 9.5 mg/dL (ref 8.4–10.5)
Chloride: 105 mEq/L (ref 96–112)
Creatinine, Ser: 0.68 mg/dL (ref 0.40–1.20)
GFR: 124.68 mL/min (ref >60.00)
Glucose, Bld: 75 mg/dL (ref 70–99)
Potassium: 4.4 mEq/L (ref 3.5–5.1)
Sodium: 138 mEq/L (ref 135–145)
Total Bilirubin: 0.4 mg/dL (ref 0.2–1.2)
Total Protein: 6.7 g/dL (ref 6.0–8.3)

## 2022-05-01 LAB — URINE CULTURE
MICRO NUMBER:: 14382743
SPECIMEN QUALITY:: ADEQUATE

## 2022-05-02 ENCOUNTER — Encounter: Payer: Self-pay | Admitting: Medical

## 2022-05-05 ENCOUNTER — Ambulatory Visit (HOSPITAL_BASED_OUTPATIENT_CLINIC_OR_DEPARTMENT_OTHER)
Admission: RE | Admit: 2022-05-05 | Discharge: 2022-05-05 | Disposition: A | Discharge status: Home / Self Care | Payer: Medicaid Other | Source: Ambulatory Visit | Attending: Medical | Admitting: Medical

## 2022-05-05 ENCOUNTER — Encounter (HOSPITAL_BASED_OUTPATIENT_CLINIC_OR_DEPARTMENT_OTHER): Payer: Self-pay

## 2022-05-05 DIAGNOSIS — R109 Unspecified abdominal pain: Secondary | ICD-10-CM

## 2022-05-05 DIAGNOSIS — M549 Dorsalgia, unspecified: Secondary | ICD-10-CM

## 2022-05-05 DIAGNOSIS — R319 Hematuria, unspecified: Secondary | ICD-10-CM

## 2022-05-06 ENCOUNTER — Other Ambulatory Visit (HOSPITAL_BASED_OUTPATIENT_CLINIC_OR_DEPARTMENT_OTHER): Payer: Self-pay | Admitting: Radiology

## 2022-05-08 MED ORDER — BENZONATATE 100 MG PO CAPS: 100.0000 mg | ORAL_CAPSULE | Freq: Three times a day (TID) | ORAL | 0 refills | Status: DC | PRN

## 2022-05-08 MED ORDER — FLUTICASONE PROPIONATE 50 MCG/ACT NA SUSP: 2.0000 | Freq: Every day | NASAL | 1 refills | Status: DC

## 2022-05-08 NOTE — Addendum Note (Signed)
Addended by: Anabel Halon on: 05/08/2022 12:25 PM   Modules accepted: Orders

## 2022-05-23 ENCOUNTER — Telehealth: Payer: Medicaid Other | Admitting: Physician Assistant

## 2022-05-23 DIAGNOSIS — L42 Pityriasis rosea: Secondary | ICD-10-CM

## 2022-05-23 MED ORDER — PRAMOXINE HCL 1 % EX LOTN: 1.0000 | TOPICAL_LOTION | Freq: Two times a day (BID) | CUTANEOUS | 0 refills | Status: DC

## 2022-05-23 NOTE — Progress Notes (Signed)
E Visit for Rash  We are sorry that you are not feeling well. Here is how we plan to help!  I feel like your rash may be consistent with Pityriasis Rosea. This is essential considered a viral exanthem and can be associated with upper respiratory symptoms. It normally will resolve on its own. The rash and discoloration can last up to 2 months. However, most rashes peak around 7-10 days after initial presentation. They can be itchy. Best is to keep area well moisturized, limit hot showers (luke warm to cool water is best to avoid activating the itch response). Can apply topical hydrocortisone cream for itching. May use benadryl 25mg  every 4-6 hours as needed for itching. Will prescribe Pramoxine cream 1% to apply topically for itching.   I will send more information about this.   If the rash does not lessen with itching in the next week, I would recommend for you to be evaluated in person for better determination. In this picture I am between the diagnosis provided and tinea versicolor. Because you have had recent URI symptoms and having pain in the rash makes me feel like it is most likely Pityriasis Rosea.    HOME CARE:  Take cool showers and avoid direct sunlight. Apply cool compress or wet dressings. Take a bath in an oatmeal bath.  Sprinkle content of one Aveeno packet under running faucet with comfortably warm water.  Bathe for 15-20 minutes, 1-2 times daily.  Pat dry with a towel. Do not rub the rash. Use hydrocortisone cream. Take an antihistamine like Benadryl for widespread rashes that itch.  The adult dose of Benadryl is 25-50 mg by mouth 4 times daily. Caution:  This type of medication may cause sleepiness.  Do not drink alcohol, drive, or operate dangerous machinery while taking antihistamines.  Do not take these medications if you have prostate enlargement.  Read package instructions thoroughly on all medications that you take.  GET HELP RIGHT AWAY IF:  Symptoms don't go away after  treatment. Severe itching that persists. If you rash spreads or swells. If you rash begins to smell. If it blisters and opens or develops a yellow-brown crust. You develop a fever. You have a sore throat. You become short of breath.  MAKE SURE YOU:  Understand these instructions. Will watch your condition. Will get help right away if you are not doing well or get worse.  Thank you for choosing an e-visit.  Your e-visit answers were reviewed by a board certified advanced clinical practitioner to complete your personal care plan. Depending upon the condition, your plan could have included both over the counter or prescription medications.  Please review your pharmacy choice. Make sure the pharmacy is open so you can pick up prescription now. If there is a problem, you may contact your provider through [employer] and have the prescription routed to another pharmacy.  Your safety is important to [place]. If you have drug allergies check your prescription carefully.   For the next 24 hours you can use MyChart to ask questions about today's visit, request a non-urgent call back, or ask for a work or school excuse. You will get an email in the next two days asking about your experience. I hope that your e-visit has been valuable and will speed your recovery.   I have spent 5 minutes in review of e-visit questionnaire, review and updating patient chart, medical decision making and response to patient.   Mar Daring, PA-C

## 2022-05-29 ENCOUNTER — Ambulatory Visit (HOSPITAL_BASED_OUTPATIENT_CLINIC_OR_DEPARTMENT_OTHER): Payer: Medicaid Other | Attending: Medical

## 2022-06-23 DIAGNOSIS — N3091 Cystitis, unspecified with hematuria: Secondary | ICD-10-CM | POA: Diagnosis not present

## 2022-06-23 DIAGNOSIS — Z6839 Body mass index (BMI) 39.0-39.9, adult: Secondary | ICD-10-CM | POA: Diagnosis not present

## 2022-06-23 DIAGNOSIS — R300 Dysuria: Secondary | ICD-10-CM | POA: Diagnosis not present

## 2022-06-23 DIAGNOSIS — Z3200 Encounter for pregnancy test, result unknown: Secondary | ICD-10-CM | POA: Diagnosis not present

## 2022-06-23 DIAGNOSIS — F419 Anxiety disorder, unspecified: Secondary | ICD-10-CM | POA: Diagnosis not present

## 2022-06-23 DIAGNOSIS — F331 Major depressive disorder, recurrent, moderate: Secondary | ICD-10-CM | POA: Diagnosis not present

## 2022-07-21 ENCOUNTER — Encounter: Payer: Self-pay | Admitting: Family Medicine

## 2022-07-21 DIAGNOSIS — L500 Allergic urticaria: Secondary | ICD-10-CM | POA: Diagnosis not present

## 2022-09-05 DIAGNOSIS — R635 Abnormal weight gain: Secondary | ICD-10-CM | POA: Diagnosis not present

## 2022-09-05 DIAGNOSIS — R5383 Other fatigue: Secondary | ICD-10-CM | POA: Diagnosis not present

## 2022-09-05 DIAGNOSIS — Z131 Encounter for screening for diabetes mellitus: Secondary | ICD-10-CM | POA: Diagnosis not present

## 2022-09-05 DIAGNOSIS — R0602 Shortness of breath: Secondary | ICD-10-CM | POA: Diagnosis not present

## 2022-09-05 DIAGNOSIS — Z6838 Body mass index (BMI) 38.0-38.9, adult: Secondary | ICD-10-CM | POA: Diagnosis not present

## 2022-09-05 DIAGNOSIS — Z3200 Encounter for pregnancy test, result unknown: Secondary | ICD-10-CM | POA: Diagnosis not present

## 2022-10-06 DIAGNOSIS — B360 Pityriasis versicolor: Secondary | ICD-10-CM | POA: Diagnosis not present

## 2022-10-06 DIAGNOSIS — R635 Abnormal weight gain: Secondary | ICD-10-CM | POA: Diagnosis not present

## 2022-10-06 DIAGNOSIS — Z3200 Encounter for pregnancy test, result unknown: Secondary | ICD-10-CM | POA: Diagnosis not present

## 2022-10-06 DIAGNOSIS — Z6837 Body mass index (BMI) 37.0-37.9, adult: Secondary | ICD-10-CM | POA: Diagnosis not present

## 2022-10-29 ENCOUNTER — Telehealth: Payer: Medicaid Other | Admitting: Family Medicine

## 2022-10-29 DIAGNOSIS — J069 Acute upper respiratory infection, unspecified: Secondary | ICD-10-CM

## 2022-10-29 MED ORDER — BENZONATATE 100 MG PO CAPS: 100.0000 mg | ORAL_CAPSULE | Freq: Two times a day (BID) | ORAL | 0 refills | Status: DC | PRN

## 2022-10-29 MED ORDER — PREDNISONE 20 MG PO TABS: 40.0000 mg | ORAL_TABLET | Freq: Every day | ORAL | 0 refills | Status: AC

## 2022-10-29 MED ORDER — ALBUTEROL SULFATE HFA 108 (90 BASE) MCG/ACT IN AERS: 2.0000 | INHALATION_SPRAY | Freq: Four times a day (QID) | RESPIRATORY_TRACT | 0 refills | Status: DC | PRN

## 2022-10-29 MED ORDER — PROMETHAZINE-DM 6.25-15 MG/5ML PO SYRP: 5.0000 mL | ORAL_SOLUTION | Freq: Four times a day (QID) | ORAL | 0 refills | Status: DC | PRN

## 2022-10-29 NOTE — Progress Notes (Signed)
E-Visit for Cough  We are sorry that you are not feeling well.  Here is how we plan to help!  Based on your presentation I believe you most likely have A cough due to a virus.  This is called viral bronchitis and is best treated by rest, plenty of fluids and control of the cough.  You may use Ibuprofen or Tylenol as directed to help your symptoms.     In addition you may use A prescription cough medication called Tessalon Perles 100mg . You may take 1-2 capsules every 8 hours as needed for your cough.  Promethazine DM for cough and sleep help.  And a rescue inhaler.  Use Flonase or Nasacort for sinus congestion. For chest congestion you can get Mucinex (plain)  I will order a delayed prescription for prednisone to be filled Friday if not improving.  From your responses in the eVisit questionnaire you describe inflammation in the upper respiratory tract which is causing a significant cough.  This is commonly called Bronchitis and has four common causes:   Allergies Viral Infections Acid Reflux Bacterial Infection Allergies, viruses and acid reflux are treated by controlling symptoms or eliminating the cause. An example might be a cough caused by taking certain blood pressure medications. You stop the cough by changing the medication. Another example might be a cough caused by acid reflux. Controlling the reflux helps control the cough.  USE OF BRONCHODILATOR ("RESCUE") INHALERS: There is a risk from using your bronchodilator too frequently.  The risk is that over-reliance on a medication which only relaxes the muscles surrounding the breathing tubes can reduce the effectiveness of medications prescribed to reduce swelling and congestion of the tubes themselves.  Although you feel brief relief from the bronchodilator inhaler, your asthma may actually be worsening with the tubes becoming more swollen and filled with mucus.  This can delay other crucial treatments, such as oral steroid  medications. If you need to use a bronchodilator inhaler daily, several times per day, you should discuss this with your provider.  There are probably better treatments that could be used to keep your asthma under control.     HOME CARE Only take medications as instructed by your medical team. Complete the entire course of an antibiotic. Drink plenty of fluids and get plenty of rest. Avoid close contacts especially the very young and the elderly Cover your mouth if you cough or cough into your sleeve. Always remember to wash your hands A steam or ultrasonic humidifier can help congestion.   GET HELP RIGHT AWAY IF: You develop worsening fever. You become short of breath You cough up blood. Your symptoms persist after you have completed your treatment plan MAKE SURE YOU  Understand these instructions. Will watch your condition. Will get help right away if you are not doing well or get worse.    Thank you for choosing an e-visit.  Your e-visit answers were reviewed by a board certified advanced clinical practitioner to complete your personal care plan. Depending upon the condition, your plan could have included both over the counter or prescription medications.  Please review your pharmacy choice. Make sure the pharmacy is open so you can pick up prescription now. If there is a problem, you may contact your provider through [employer] and have the prescription routed to another pharmacy.  Your safety is important to [place]. If you have drug allergies check your prescription carefully.   For the next 24 hours you can use MyChart to ask questions  about today's visit, request a non-urgent call back, or ask for a work or school excuse. You will get an email in the next two days asking about your experience. I hope that your e-visit has been valuable and will speed your recovery.  I provided 5 minutes of non face-to-face time during this encounter for chart review, medication and order  placement, as well as and documentation.

## 2022-11-21 DIAGNOSIS — Z6836 Body mass index (BMI) 36.0-36.9, adult: Secondary | ICD-10-CM | POA: Diagnosis not present

## 2022-11-21 DIAGNOSIS — R635 Abnormal weight gain: Secondary | ICD-10-CM | POA: Diagnosis not present

## 2022-11-21 DIAGNOSIS — E6609 Other obesity due to excess calories: Secondary | ICD-10-CM | POA: Diagnosis not present

## 2022-11-21 DIAGNOSIS — Z3200 Encounter for pregnancy test, result unknown: Secondary | ICD-10-CM | POA: Diagnosis not present

## 2023-03-12 ENCOUNTER — Telehealth: Payer: Medicaid Other | Admitting: Physician Assistant

## 2023-03-12 DIAGNOSIS — R6889 Other general symptoms and signs: Secondary | ICD-10-CM

## 2023-03-12 MED ORDER — FLUTICASONE PROPIONATE 50 MCG/ACT NA SUSP: 2.0000 | Freq: Every day | NASAL | 0 refills | Status: AC

## 2023-03-12 MED ORDER — BENZONATATE 100 MG PO CAPS: 100.0000 mg | ORAL_CAPSULE | Freq: Three times a day (TID) | ORAL | 0 refills | Status: DC | PRN

## 2023-03-12 MED ORDER — LIDOCAINE VISCOUS HCL 2 % MT SOLN: OROMUCOSAL | 0 refills | Status: DC

## 2023-03-12 MED ORDER — NAPROXEN 500 MG PO TABS: 500.0000 mg | ORAL_TABLET | Freq: Two times a day (BID) | ORAL | 0 refills | Status: DC

## 2023-03-12 NOTE — Progress Notes (Signed)
E-Visit for [employer] Virus / COVID Screening  Your current symptoms could be consistent with COVID.  Please complete a Covid test either at home or check with your local pharmacy to see if they provide testing. Now available over the counter by [employer] is an at home test kit that will test for Covid 19 and Flu.   You have tested positive for COVID-19, meaning that you were infected with the novel coronavirus and could give the virus to others.  Most people with COVID-19 have mild illness and can recover at home without medical care. Do not leave your home, except to get medical care. Do not visit public areas and do not go to places where you are unable to wear a mask. It is important that you stay home  to take care for yourself and to help protect other people in your home and community.      Isolation Instructions:   You are to isolate at home until you have been fever free for at least 24 hours without a fever-reducing medication, and symptoms have been steadily improving for 24 hours. At that time,  you can end isolation but need to mask for an additional 5 days.  If you must be around other household members who do not have symptoms, you need to make sure that both you and the family members are masking consistently with a high-quality mask.  If you note any worsening of symptoms despite treatment, please seek an in-person evaluation ASAP. If you note any significant shortness of breath or any chest pain, please seek ER evaluation. Please do not delay care!  Go to the nearest hospital ED for assessment if fever/cough/breathlessness are severe or illness seems like a threat to life.    The following symptoms may appear 2-14 days after exposure: Fever Cough Shortness of breath or difficulty breathing Chills Repeated shaking with chills Muscle pain Headache Sore throat New loss of taste or smell Fatigue Congestion or runny nose Nausea or vomiting Diarrhea  You can use medication such  as prescription anti-inflammatory called Naprosyn 500 mg. Take twice daily as needed for fever or body aches for 2 weeks and Viscous Lidocaine 2% Swallow 5-10 mL every 6 hours as needed for sore throat.  You may also take acetaminophen (Tylenol) as needed for fever.  HOME CARE Only take medications as instructed by your medical team. Drink plenty of fluids and get plenty of rest. A steam or ultrasonic humidifier can help if you have congestion.  GET HELP RIGHT AWAY IF YOU HAVE EMERGENCY WARNING SIGNS.  Call 911 or proceed to your closest emergency facility if: You develop worsening high fever. Trouble breathing Bluish lips or face Persistent pain or pressure in the chest New confusion Inability to wake or stay awake You cough up blood. Your symptoms become more severe Inability to hold down food or fluids  This list is not all possible symptoms. Contact your medical provider for any symptoms that are severe or concerning to you.   Your e-visit answers were reviewed by a board certified advanced clinical practitioner to complete your personal care plan.  Depending on the condition, your plan could have included both over the counter or prescription medications.  If there is a problem, please reply once you have received a response from your provider.  Your safety is important to [place].  If you have drug allergies check your prescription carefully.    You can use MyChart to ask questions about today's visit, request a  non-urgent call back, or ask for a work or school excuse for 24 hours related to this e-Visit. If it has been greater than 24 hours you will need to follow up with your provider or enter a new e-Visit to address those concerns. You will get an e-mail in the next two days asking about your experience.  I hope that your e-visit has been valuable and will speed your recovery. Thank you for using e-visits.   I have spent 5 minutes in review of e-visit questionnaire, review and  updating patient chart, medical decision making and response to patient.   Margaretann Loveless, PA-C

## 2023-03-12 NOTE — Addendum Note (Signed)
Addended by: Margaretann Loveless on: 03/12/2023 06:22 PM   Modules accepted: Orders

## 2023-03-12 NOTE — Progress Notes (Signed)
E-Visit for Upper Respiratory Infection   We are sorry you are not feeling well.  Here is how we plan to help!  Based on what you have shared with me, it looks like you may have a viral upper respiratory infection.  Upper respiratory infections are caused by a large number of viruses; however, rhinovirus is the most common cause.   Symptoms vary from person to person, with common symptoms including sore throat, cough, fatigue or lack of energy and feeling of general discomfort.  A low-grade fever of up to 100.4 may present, but is often uncommon.  Symptoms vary however, and are closely related to a person's age or underlying illnesses.  The most common symptoms associated with an upper respiratory infection are nasal discharge or congestion, cough, sneezing, headache and pressure in the ears and face.  These symptoms usually persist for about 3 to 10 days, but can last up to 2 weeks.  It is important to know that upper respiratory infections do not cause serious illness or complications in most cases.    Upper respiratory infections can be transmitted from person to person, with the most common method of transmission being a person's hands.  The virus is able to live on the skin and can infect other persons for up to 2 hours after direct contact.  Also, these can be transmitted when someone coughs or sneezes; thus, it is important to cover the mouth to reduce this risk.  To keep the spread of the illness at bay, good hand hygiene is very important.  This is an infection that is most likely caused by a virus. There are no specific treatments other than to help you with the symptoms until the infection runs its course.  We are sorry you are not feeling well.  Here is how we plan to help!   For nasal congestion, you may use an oral decongestants such as Mucinex D or if you have glaucoma or high blood pressure use plain Mucinex.  Saline nasal spray or nasal drops can help and can safely be used as often as  needed for congestion.  For your congestion, I have prescribed Fluticasone nasal spray one spray in each nostril twice a day  If you do not have a history of heart disease, hypertension, diabetes or thyroid disease, prostate/bladder issues or glaucoma, you may also use Sudafed to treat nasal congestion.  It is highly recommended that you consult with a pharmacist or your primary care physician to ensure this medication is safe for you to take.     If you have a cough, you may use cough suppressants such as Delsym and Robitussin.  If you have glaucoma or high blood pressure, you can also use Coricidin HBP.   For cough I have prescribed for you A prescription cough medication called Tessalon Perles 100 mg. You may take 1-2 capsules every 8 hours as needed for cough  If you have a sore or scratchy throat, use a saltwater gargle-  to  teaspoon of salt dissolved in a 4-ounce to 8-ounce glass of warm water.  Gargle the solution for approximately 15-30 seconds and then spit.  It is important not to swallow the solution.  You can also use throat lozenges/cough drops and Chloraseptic spray to help with throat pain or discomfort.  Warm or cold liquids can also be helpful in relieving throat pain.  For headache, pain or general discomfort, you can use Ibuprofen or Tylenol as directed.   Some authorities believe   that zinc sprays or the use of Echinacea may shorten the course of your symptoms.   HOME CARE Only take medications as instructed by your medical team. Be sure to drink plenty of fluids. Water is fine as well as fruit juices, sodas and electrolyte beverages. You may want to stay away from caffeine or alcohol. If you are nauseated, try taking small sips of liquids. How do you know if you are getting enough fluid? Your urine should be a pale yellow or almost colorless. Get rest. Taking a steamy shower or using a humidifier may help nasal congestion and ease sore throat pain. You can place a towel over  your head and breathe in the steam from hot water coming from a faucet. Using a saline nasal spray works much the same way. Cough drops, hard candies and sore throat lozenges may ease your cough. Avoid close contacts especially the very young and the elderly Cover your mouth if you cough or sneeze Always remember to wash your hands.   GET HELP RIGHT AWAY IF: You develop worsening fever. If your symptoms do not improve within 10 days You develop yellow or green discharge from your nose over 3 days. You have coughing fits You develop a severe head ache or visual changes. You develop shortness of breath, difficulty breathing or start having chest pain Your symptoms persist after you have completed your treatment plan  MAKE SURE YOU  Understand these instructions. Will watch your condition. Will get help right away if you are not doing well or get worse.  Thank you for choosing an e-visit.  Your e-visit answers were reviewed by a board certified advanced clinical practitioner to complete your personal care plan. Depending upon the condition, your plan could have included both over the counter or prescription medications.  Please review your pharmacy choice. Make sure the pharmacy is open so you can pick up prescription now. If there is a problem, you may contact your provider through MyChart messaging and have the prescription routed to another pharmacy.  Your safety is important to us. If you have drug allergies check your prescription carefully.   For the next 24 hours you can use MyChart to ask questions about today's visit, request a non-urgent call back, or ask for a work or school excuse. You will get an email in the next two days asking about your experience. I hope that your e-visit has been valuable and will speed your recovery.  I have spent 5 minutes in review of e-visit questionnaire, review and updating patient chart, medical decision making and response to patient.    Tejuan Gholson M Zella Dewan, PA-C  

## 2023-03-13 ENCOUNTER — Telehealth: Payer: Medicaid Other | Admitting: Family Medicine

## 2023-03-13 DIAGNOSIS — J029 Acute pharyngitis, unspecified: Secondary | ICD-10-CM

## 2023-03-13 NOTE — Progress Notes (Signed)
E-Visit for Sore Throat  We are sorry that you are not feeling well.  Here is how we plan to help!  Your symptoms indicate a likely viral infection (Pharyngitis).   Pharyngitis is inflammation in the back of the throat which can cause a sore throat, scratchiness and sometimes difficulty swallowing.   Pharyngitis is typically caused by a respiratory virus and will just run its course.  Please keep in mind that your symptoms could last up to 10 days.  For throat pain, we recommend over the counter oral pain relief medications such as acetaminophen or aspirin, or anti-inflammatory medications such as ibuprofen or naproxen sodium.  Topical treatments such as oral throat lozenges or sprays may be used as needed.  Avoid close contact with loved ones, especially the very young and elderly.  Remember to wash your hands thoroughly throughout the day as this is the number one way to prevent the spread of infection and wipe down door knobs and counters with disinfectant.  After careful review of your answers, I would not recommend an antibiotic for your condition.  Antibiotics should not be used to treat conditions that we suspect are caused by viruses like the virus that causes the common cold or flu. However, some people can have Strep with atypical symptoms. You may need formal testing in clinic or office to confirm if your symptoms continue or worsen.  Providers prescribe antibiotics to treat infections caused by bacteria. Antibiotics are very powerful in treating bacterial infections when they are used properly.  To maintain their effectiveness, they should be used only when necessary.  Overuse of antibiotics has resulted in the development of super bugs that are resistant to treatment!    Home Care: Only take medications as instructed by your medical team. Do not drink alcohol while taking these medications. A steam or ultrasonic humidifier can help congestion.  You can place a towel over your head and  breathe in the steam from hot water coming from a faucet. Avoid close contacts especially the very young and the elderly. Cover your mouth when you cough or sneeze. Always remember to wash your hands.  Get Help Right Away If: You develop worsening fever or throat pain. You develop a severe head ache or visual changes. Your symptoms persist after you have completed your treatment plan.  Make sure you Understand these instructions. Will watch your condition. Will get help right away if you are not doing well or get worse.   Thank you for choosing an e-visit.  Your e-visit answers were reviewed by a board certified advanced clinical practitioner to complete your personal care plan. Depending upon the condition, your plan could have included both over the counter or prescription medications.  Please review your pharmacy choice. Make sure the pharmacy is open so you can pick up prescription now. If there is a problem, you may contact your provider through MyChart messaging and have the prescription routed to another pharmacy.  Your safety is important to us. If you have drug allergies check your prescription carefully.   For the next 24 hours you can use MyChart to ask questions about today's visit, request a non-urgent call back, or ask for a work or school excuse. You will get an email in the next two days asking about your experience. I hope that your e-visit has been valuable and will speed your recovery.    have provided 5 minutes of non face to face time during this encounter for chart review and documentation.   

## 2023-03-29 ENCOUNTER — Telehealth: Payer: Medicaid Other | Admitting: Family

## 2023-03-29 DIAGNOSIS — J4521 Mild intermittent asthma with (acute) exacerbation: Secondary | ICD-10-CM | POA: Diagnosis not present

## 2023-03-29 MED ORDER — ALBUTEROL SULFATE HFA 108 (90 BASE) MCG/ACT IN AERS: 2.0000 | INHALATION_SPRAY | Freq: Four times a day (QID) | RESPIRATORY_TRACT | 0 refills | Status: AC | PRN

## 2023-03-29 MED ORDER — PREDNISONE 20 MG PO TABS: 40.0000 mg | ORAL_TABLET | Freq: Every day | ORAL | 0 refills | Status: AC

## 2023-03-29 NOTE — Progress Notes (Signed)

## 2023-06-10 DIAGNOSIS — R509 Fever, unspecified: Secondary | ICD-10-CM | POA: Diagnosis not present

## 2023-06-10 DIAGNOSIS — R051 Acute cough: Secondary | ICD-10-CM | POA: Diagnosis not present

## 2023-06-10 DIAGNOSIS — J029 Acute pharyngitis, unspecified: Secondary | ICD-10-CM | POA: Diagnosis not present

## 2023-06-14 ENCOUNTER — Telehealth: Payer: Medicaid Other | Admitting: Nurse Practitioner

## 2023-06-14 ENCOUNTER — Encounter: Payer: Medicaid Other | Admitting: Nurse Practitioner

## 2023-06-14 DIAGNOSIS — U071 COVID-19: Secondary | ICD-10-CM

## 2023-06-14 MED ORDER — MOLNUPIRAVIR EUA 200MG CAPSULE: 4.0000 | ORAL_CAPSULE | Freq: Two times a day (BID) | ORAL | 0 refills | Status: AC

## 2023-06-14 NOTE — Patient Instructions (Signed)
[name], thank you for joining Claiborne Rigg, NP for today's virtual visit.  While this provider is not your primary care provider (PCP), if your PCP is located in our provider database this encounter information will be shared with them immediately following your visit.   A [facility] MyChart account gives you access to today's visit and all your visits, tests, and labs performed at [facility] " click here if you don't have a [name] MyChart account or go to [email or web address]  Consent: (Patient) Cindy Townsend provided verbal consent for this virtual visit at the beginning of the encounter.  Current Medications:  Current Outpatient Medications:    molnupiravir EUA (LAGEVRIO) 200 mg CAPS capsule, Take 4 capsules (800 mg total) by mouth 2 (two) times daily for 5 days., Disp: 40 capsule, Rfl: 0   albuterol (VENTOLIN HFA) 108 (90 Base) MCG/ACT inhaler, Inhale 2 puffs into the lungs every 6 (six) hours as needed for wheezing or shortness of breath., Disp: 8 g, Rfl: 0   benzonatate (TESSALON) 100 MG capsule, Take 1-2 capsules (100-200 mg total) by mouth 3 (three) times daily as needed., Disp: 30 capsule, Rfl: 0   etonogestrel (NEXPLANON) 68 MG IMPL implant, 1 each by Subdermal route once., Disp: , Rfl:    fluticasone (FLONASE) 50 MCG/ACT nasal spray, Place 2 sprays into both nostrils daily., Disp: 16 g, Rfl: 0   lidocaine (XYLOCAINE) 2 % solution, Swallow 5-10 mL every 6 hours as needed for sore throat, Disp: 100 mL, Rfl: 0   naproxen (NAPROSYN) 500 MG tablet, Take 1 tablet (500 mg total) by mouth 2 (two) times daily with a meal., Disp: 30 tablet, Rfl: 0   Medications ordered in this encounter:  Meds ordered this encounter  Medications   molnupiravir EUA (LAGEVRIO) 200 mg CAPS capsule    Sig: Take 4 capsules (800 mg total) by mouth 2 (two) times daily for 5 days.    Dispense:  40 capsule    Refill:  0    Supervising Provider:   Merrilee Jansky  [[id number]]     *If you need refills on other medications prior to your next appointment, please contact your pharmacy*  Follow-Up: Call back or seek an in-person evaluation if the symptoms worsen or if the condition fails to improve as anticipated.  [facility] [phone]  Other Instructions  Please keep well-hydrated and get plenty of rest. Start a saline nasal rinse to flush out your nasal passages. You can use plain Mucinex to help thin congestion. If you have a humidifier, you can use this daily as needed.    You are to wear a mask for 5 days from onset of your symptoms.  After day 5, if you have had no fever and you are feeling better with NO symptoms, you can end masking. Keep in mind you can be contagious 10 days from the onset of symptoms  After day 5 if you have a fever or are having significant symptoms, please wear your mask for full 10 days.   If you note any worsening of symptoms, any significant shortness of breath or any chest pain, please seek ER evaluation ASAP.  Please do not delay care!    If you note any worsening of symptoms, any significant shortness of breath or any chest pain, please seek ER evaluation ASAP.  Please do not delay care!    If you have been instructed to have an in-person evaluation today at a  local Urgent Care facility, please use the link below. It will take you to a list of all of our available [facility], including address, phone number and hours of operation. Please do not delay care.  [facility]  If you or a family member do not have a primary care provider, use the link below to schedule a visit and establish care. When you choose a [facility] primary care physician or advanced practice provider, you gain a long-term partner in health. Find a Primary Care Provider  Learn more about [facility]'s in-office and virtual care options: [facility] - Get Care Now

## 2023-06-14 NOTE — Progress Notes (Signed)
Virtual Visit Consent   [name], you are scheduled for a virtual visit with a [facility] provider today. Just as with appointments in the office, your consent must be obtained to participate. Your consent will be active for this visit and any virtual visit you may have with one of our providers in the next 365 days. If you have a MyChart account, a copy of this consent can be sent to you electronically.  As this is a virtual visit, video technology does not allow for your provider to perform a traditional examination. This may limit your provider's ability to fully assess your condition. If your provider identifies any concerns that need to be evaluated in person or the need to arrange testing (such as labs, EKG, etc.), we will make arrangements to do so. Although advances in technology are sophisticated, we cannot ensure that it will always work on either your end or our end. If the connection with a video visit is poor, the visit may have to be switched to a telephone visit. With either a video or telephone visit, we are not always able to ensure that we have a secure connection.  By engaging in this virtual visit, you consent to the provision of healthcare and authorize for your insurance to be billed (if applicable) for the services provided during this visit. Depending on your insurance coverage, you may receive a charge related to this service.  I need to obtain your verbal consent now. Are you willing to proceed with your visit today? Cindy Townsend has provided verbal consent on 06/14/2023 for a virtual visit (video or telephone). Claiborne Rigg, NP  Date: 06/14/2023 4:03 PM   Virtual Visit via Video Note   I, Claiborne Rigg, connected with  Cindy Townsend  ([id number], April 13, 2001) on 06/14/23 at  4:00 PM EST by a video-enabled telemedicine application and verified that I am speaking with the correct person using two identifiers.  Location: Patient: Virtual Visit  Location Patient: Home Provider: Virtual Visit Location Provider: Home Office   I discussed the limitations of evaluation and management by telemedicine and the availability of in person appointments. The patient expressed understanding and agreed to proceed.    History of Present Illness: Cindy Townsend is a 23 y.o. who identifies as a female who was assigned female at birth, and is being seen today for COVID positive.  Ms. Fleeger was treated for strep on 2-12. For the past 4 days she has been experiencing sore throat, headache, loss of sense of taste, nasal congestion and cough. She was prescribed keflex for strep. Currently does not feel any better and tested positive for COVID last night.   Problems:  Patient Active Problem List   Diagnosis Date Noted   ADHD (attention deficit hyperactivity disorder) 02/21/2022   UTI (urinary tract infection) 10/17/2021   Syncope 10/17/2021   Hyperglycemia 10/17/2021   Low back pain 10/17/2021   Thrombocytosis 10/17/2021   Pyelonephritis 06/26/2021   Headache 05/27/2015   Knee pain 09/24/2014   Anxiety state 07/24/2014   Allergic rhinitis 06/26/2014   Abdominal pain 02/27/2014   Encounter for repeat prescription of oral contraceptives 01/24/2014   Urticaria 03/01/2012   Preventative health care 02/22/2012   ADD (attention deficit disorder) 02/22/2012   Asthma    Eczema    Chronic otitis media 05/2011    Allergies:  Allergies  Allergen Reactions   Ciprofloxacin Itching    10/21/21 -- pt given cipro used for 2 weeks ,  no side effects .   Penicillins Hives    Has patient had a PCN reaction causing immediate rash, facial/tongue/throat swelling, SOB or lightheadedness with hypotension: yes Has patient had a PCN reaction causing severe rash involving mucus membranes or skin necrosis: no Has patient had a PCN reaction that required hospitalization no Has patient had a PCN reaction occurring within the last 10 years: yes If all of the  above answers are "NO", then may proceed with Cephalosporin use.   Soap Rash    Has to use sensitive skin products   Medications:  Current Outpatient Medications:    molnupiravir EUA (LAGEVRIO) 200 mg CAPS capsule, Take 4 capsules (800 mg total) by mouth 2 (two) times daily for 5 days., Disp: 40 capsule, Rfl: 0   albuterol (VENTOLIN HFA) 108 (90 Base) MCG/ACT inhaler, Inhale 2 puffs into the lungs every 6 (six) hours as needed for wheezing or shortness of breath., Disp: 8 g, Rfl: 0   benzonatate (TESSALON) 100 MG capsule, Take 1-2 capsules (100-200 mg total) by mouth 3 (three) times daily as needed., Disp: 30 capsule, Rfl: 0   etonogestrel (NEXPLANON) 68 MG IMPL implant, 1 each by Subdermal route once., Disp: , Rfl:    fluticasone (FLONASE) 50 MCG/ACT nasal spray, Place 2 sprays into both nostrils daily., Disp: 16 g, Rfl: 0   lidocaine (XYLOCAINE) 2 % solution, Swallow 5-10 mL every 6 hours as needed for sore throat, Disp: 100 mL, Rfl: 0   naproxen (NAPROSYN) 500 MG tablet, Take 1 tablet (500 mg total) by mouth 2 (two) times daily with a meal., Disp: 30 tablet, Rfl: 0  Observations/Objective: Patient is well-developed, well-nourished in no acute distress.  Resting comfortably at home.  Head is normocephalic, atraumatic.  No labored breathing.  Speech is clear and coherent with logical content.  Patient is alert and oriented at baseline.    Assessment and Plan: 1. Positive self-administered antigen test for COVID-19 (Primary) - molnupiravir EUA (LAGEVRIO) 200 mg CAPS capsule; Take 4 capsules (800 mg total) by mouth 2 (two) times daily for 5 days.  Dispense: 40 capsule; Refill: 0   Please keep well-hydrated and get plenty of rest. Start a saline nasal rinse to flush out your nasal passages. You can use plain Mucinex to help thin congestion. If you have a humidifier, you can use this daily as needed.    You are to wear a mask for 5 days from onset of your symptoms.  After day 5, if you  have had no fever and you are feeling better with NO symptoms, you can end masking. Keep in mind you can be contagious 10 days from the onset of symptoms  After day 5 if you have a fever or are having significant symptoms, please wear your mask for full 10 days.   If you note any worsening of symptoms, any significant shortness of breath or any chest pain, please seek ER evaluation ASAP.  Please do not delay care!    If you note any worsening of symptoms, any significant shortness of breath or any chest pain, please seek ER evaluation ASAP.  Please do not delay care!   Follow Up Instructions: I discussed the assessment and treatment plan with the patient. The patient was provided an opportunity to ask questions and all were answered. The patient agreed with the plan and demonstrated an understanding of the instructions.  A copy of instructions were sent to the patient via MyChart unless otherwise noted below.  The patient was advised to call back or seek an in-person evaluation if the symptoms worsen or if the condition fails to improve as anticipated.    Claiborne Rigg, NP

## 2023-06-14 NOTE — Progress Notes (Signed)
COVID POSITIVE. Recommend virtual visit

## 2023-07-28 ENCOUNTER — Encounter (HOSPITAL_BASED_OUTPATIENT_CLINIC_OR_DEPARTMENT_OTHER): Payer: Self-pay

## 2023-07-28 ENCOUNTER — Emergency Department (HOSPITAL_BASED_OUTPATIENT_CLINIC_OR_DEPARTMENT_OTHER)

## 2023-07-28 ENCOUNTER — Other Ambulatory Visit: Payer: Self-pay

## 2023-07-28 ENCOUNTER — Emergency Department (HOSPITAL_BASED_OUTPATIENT_CLINIC_OR_DEPARTMENT_OTHER)
Admission: EM | Admit: 2023-07-28 | Discharge: 2023-07-29 | Disposition: A | Discharge status: Home / Self Care | Attending: Emergency Medicine | Admitting: Emergency Medicine

## 2023-07-28 DIAGNOSIS — N200 Calculus of kidney: Secondary | ICD-10-CM | POA: Diagnosis not present

## 2023-07-28 DIAGNOSIS — N854 Malposition of uterus: Secondary | ICD-10-CM | POA: Diagnosis not present

## 2023-07-28 DIAGNOSIS — R109 Unspecified abdominal pain: Secondary | ICD-10-CM | POA: Diagnosis not present

## 2023-07-28 DIAGNOSIS — N83201 Unspecified ovarian cyst, right side: Secondary | ICD-10-CM | POA: Insufficient documentation

## 2023-07-28 DIAGNOSIS — N83291 Other ovarian cyst, right side: Secondary | ICD-10-CM | POA: Diagnosis not present

## 2023-07-28 DIAGNOSIS — R1031 Right lower quadrant pain: Secondary | ICD-10-CM | POA: Diagnosis not present

## 2023-07-28 LAB — URINALYSIS, ROUTINE W REFLEX MICROSCOPIC
Bilirubin Urine: NEGATIVE
Glucose, UA: NEGATIVE mg/dL
Hgb urine dipstick: NEGATIVE
Ketones, ur: NEGATIVE mg/dL
Nitrite: NEGATIVE
Protein, ur: NEGATIVE mg/dL
Specific Gravity, Urine: 1.025 (ref 1.005–1.030)
pH: 7 (ref 5.0–8.0)

## 2023-07-28 LAB — PREGNANCY, URINE: Preg Test, Ur: NEGATIVE

## 2023-07-28 LAB — URINALYSIS, MICROSCOPIC (REFLEX): RBC / HPF: NONE SEEN RBC/hpf (ref 0–5)

## 2023-07-28 MED ORDER — KETOROLAC TROMETHAMINE 30 MG/ML IJ SOLN
15.0000 mg | Freq: Once | INTRAMUSCULAR | Status: AC
  Administered 2023-07-29: 15 mg via INTRAVENOUS
  Filled 2023-07-28: qty 1

## 2023-07-28 NOTE — ED Provider Notes (Incomplete)
 [place] [facility]PARTMENT AT [facility] Provider Note   CSN: [id number] Arrival date & time: 07/28/23  2250     History {Add pertinent medical, surgical, social history, OB history to HPI:1} Chief Complaint  Patient presents with   Rt sided flank pain    Cindy Townsend is a 23 y.o. female.  Bilateral flank pain for the past 5 days right greater than left.  No fall or injury.  Pain is mostly to her right lower back and radiates down her right buttock.  No focal weakness, numbness or tingling.  Some nausea and hot flashes but no documented fever.  Some pain with urination but no blood in the urine.  History of kidney stones in the past and this feels similar.  Still has appendix and gallbladder.  Taking ibuprofen with partial relief.  Denies chest pain or shortness of breath.  Denies abdominal pain.  Denies hematuria, vaginal bleeding or discharge.  Some dysuria.  No fever but has had hot flashes and nausea.  No vomiting.  Denies possibility of pregnancy.  The history is provided by the patient.       Home Medications Prior to Admission medications   Medication Sig Start Date End Date Taking? Authorizing Provider  albuterol (VENTOLIN HFA) 108 (90 Base) MCG/ACT inhaler Inhale 2 puffs into the lungs every 6 (six) hours as needed for wheezing or shortness of breath. 03/29/23   Jannifer Rodney A, FNP  benzonatate (TESSALON) 100 MG capsule Take 1-2 capsules (100-200 mg total) by mouth 3 (three) times daily as needed. 03/12/23   Margaretann Loveless, PA-C  etonogestrel (NEXPLANON) 68 MG IMPL implant 1 each by Subdermal route once.    [provider]  fluticasone (FLONASE) 50 MCG/ACT nasal spray Place 2 sprays into both nostrils daily. 03/12/23   Margaretann Loveless, PA-C  lidocaine (XYLOCAINE) 2 % solution Swallow 5-10 mL every 6 hours as needed for sore throat 03/12/23   Joycelyn Man M, PA-C  naproxen (NAPROSYN) 500 MG tablet Take 1 tablet (500 mg total) by  mouth 2 (two) times daily with a meal. 03/12/23   Margaretann Loveless, PA-C      Allergies    Ciprofloxacin, Penicillins, and Soap    Review of Systems   Review of Systems  Constitutional:  Negative for activity change, appetite change and fever.  HENT:  Negative for congestion and sinus pressure.   Respiratory:  Negative for cough, chest tightness and shortness of breath.   Gastrointestinal:  Positive for nausea. Negative for abdominal pain and vomiting.  Genitourinary:  Negative for dysuria and hematuria.  Musculoskeletal:  Positive for arthralgias, back pain and myalgias.  Skin:  Negative for rash.  Neurological:  Negative for dizziness, weakness and headaches.    all other systems are negative except as noted in the HPI and PMH.   Physical Exam Updated Vital Signs BP 122/85 (BP Location: Right Arm)   Pulse 89   Temp 98.6 F (37 C)   Resp 18   Ht 5\' 3"  (1.6 m)   Wt 99.8 kg   LMP  (LMP Unknown)   SpO2 96%   BMI 38.97 kg/m  Physical Exam Vitals and nursing note reviewed.  Constitutional:      General: She is not in acute distress.    Appearance: She is well-developed.  HENT:     Head: Normocephalic and atraumatic.     Mouth/Throat:     Pharynx: No oropharyngeal exudate.  Eyes:  Conjunctiva/sclera: Conjunctivae normal.     Pupils: Pupils are equal, round, and reactive to light.  Neck:     Comments: No meningismus. Cardiovascular:     Rate and Rhythm: Normal rate and regular rhythm.     Heart sounds: Normal heart sounds. No murmur heard. Pulmonary:     Effort: Pulmonary effort is normal. No respiratory distress.     Breath sounds: Normal breath sounds.  Abdominal:     Palpations: Abdomen is soft.     Tenderness: There is no abdominal tenderness. There is no guarding or rebound.  Musculoskeletal:        General: Tenderness present. Normal range of motion.     Cervical back: Normal range of motion and neck supple.     Comments: R CVAT  5/5 strength in  bilateral lower extremities. Ankle plantar and dorsiflexion intact. Great toe extension intact bilaterally. +2 DP and PT pulses. +2 patellar reflexes bilaterally. Normal gait.   Skin:    General: Skin is warm.  Neurological:     Mental Status: She is alert and oriented to person, place, and time.     Cranial Nerves: No cranial nerve deficit.     Motor: No abnormal muscle tone.     Coordination: Coordination normal.     Comments:  5/5 strength throughout. CN 2-12 intact.Equal grip strength.   Psychiatric:        Behavior: Behavior normal.     ED Results / Procedures / Treatments   Labs (all labs ordered are listed, but only abnormal results are displayed) Labs Reviewed  URINALYSIS, ROUTINE W REFLEX MICROSCOPIC - Abnormal; Notable for the following components:      Result Value   Leukocytes,Ua TRACE (*)    All other components within normal limits  URINALYSIS, MICROSCOPIC (REFLEX) - Abnormal; Notable for the following components:   Bacteria, UA FEW (*)    All other components within normal limits  PREGNANCY, URINE  CBC WITH DIFFERENTIAL/PLATELET  BASIC METABOLIC PANEL WITH GFR  LIPASE, BLOOD  HEPATIC FUNCTION PANEL    EKG None  Radiology No results found.  Procedures Procedures  {Document cardiac monitor, telemetry assessment procedure when appropriate:1}  Medications Ordered in ED Medications  ketorolac (TORADOL) 30 MG/ML injection 15 mg (has no administration in time range)    ED Course/ Medical Decision Making/ A&P   {   Click here for ABCD2, HEART and other calculatorsREFRESH Note before signing :1}                              Medical Decision Making Amount and/or Complexity of Data Reviewed Labs: ordered. Decision-making details documented in ED Course. Radiology: ordered and independent interpretation performed. Decision-making details documented in ED Course. ECG/medicine tests: ordered and independent interpretation performed. Decision-making details  documented in ED Course.  Risk Prescription drug management.   Right flank pain x 5 days with nausea, dysuria and hot flashes.  No fever or vomiting.  Vital stable, no distress.  Intact distal strength, sensation, pulses and reflexes.  Low concern for cord compression or cauda equina.  hCG is negative.  Urinalysis negative for UTI.  {Document critical care time when appropriate:1} {Document review of labs and clinical decision tools ie heart score, Chads2Vasc2 etc:1}  {Document your independent review of radiology images, and any outside records:1} {Document your discussion with family members, caretakers, and with consultants:1} {Document social determinants of health affecting pt's care:1} {Document your decision making why  or why not admission, treatments were needed:1} Final Clinical Impression(s) / ED Diagnoses Final diagnoses:  None    Rx / DC Orders ED Discharge Orders     None

## 2023-07-28 NOTE — ED Triage Notes (Addendum)
 Pt c/o rt. Sided flank pain x 5 days, today being the worst +nausea and hot flashes Hx of kidney stones +dysuria

## 2023-07-29 ENCOUNTER — Emergency Department (HOSPITAL_BASED_OUTPATIENT_CLINIC_OR_DEPARTMENT_OTHER)

## 2023-07-29 DIAGNOSIS — R1031 Right lower quadrant pain: Secondary | ICD-10-CM | POA: Diagnosis not present

## 2023-07-29 DIAGNOSIS — N83291 Other ovarian cyst, right side: Secondary | ICD-10-CM | POA: Diagnosis not present

## 2023-07-29 DIAGNOSIS — N854 Malposition of uterus: Secondary | ICD-10-CM | POA: Diagnosis not present

## 2023-07-29 LAB — CBC WITH DIFFERENTIAL/PLATELET
Abs Immature Granulocytes: 0.02 10*3/uL (ref 0.00–0.07)
Basophils Absolute: 0 10*3/uL (ref 0.0–0.1)
Basophils Relative: 0 %
Eosinophils Absolute: 0.1 10*3/uL (ref 0.0–0.5)
Eosinophils Relative: 2 %
HCT: 43.4 % (ref 36.0–46.0)
Hemoglobin: 14.8 g/dL (ref 12.0–15.0)
Immature Granulocytes: 0 %
Lymphocytes Relative: 47 %
Lymphs Abs: 4.4 10*3/uL — ABNORMAL HIGH (ref 0.7–4.0)
MCH: 30.2 pg (ref 26.0–34.0)
MCHC: 34.1 g/dL (ref 30.0–36.0)
MCV: 88.6 fL (ref 80.0–100.0)
Monocytes Absolute: 0.7 10*3/uL (ref 0.1–1.0)
Monocytes Relative: 8 %
Neutro Abs: 4 10*3/uL (ref 1.7–7.7)
Neutrophils Relative %: 43 %
Platelets: 383 10*3/uL (ref 150–400)
RBC: 4.9 MIL/uL (ref 3.87–5.11)
RDW: 12.6 % (ref 11.5–15.5)
WBC: 9.4 10*3/uL (ref 4.0–10.5)
nRBC: 0 % (ref 0.0–0.2)

## 2023-07-29 LAB — BASIC METABOLIC PANEL WITH GFR
Anion gap: 9 (ref 5–15)
BUN: 13 mg/dL (ref 6–20)
CO2: 21 mmol/L — ABNORMAL LOW (ref 22–32)
Calcium: 9 mg/dL (ref 8.9–10.3)
Chloride: 107 mmol/L (ref 98–111)
Creatinine, Ser: 0.77 mg/dL (ref 0.44–1.00)
GFR, Estimated: >60 mL/min (ref >60)
Glucose, Bld: 89 mg/dL (ref 70–99)
Potassium: 3.8 mmol/L (ref 3.5–5.1)
Sodium: 137 mmol/L (ref 135–145)

## 2023-07-29 LAB — HEPATIC FUNCTION PANEL
ALT: 45 U/L — ABNORMAL HIGH (ref 0–44)
AST: 29 U/L (ref 15–41)
Albumin: 3.9 g/dL (ref 3.5–5.0)
Alkaline Phosphatase: 59 U/L (ref 38–126)
Bilirubin, Direct: 0.2 mg/dL (ref 0.0–0.2)
Indirect Bilirubin: 0.4 mg/dL (ref 0.3–0.9)
Total Bilirubin: 0.6 mg/dL (ref 0.0–1.2)
Total Protein: 7.1 g/dL (ref 6.5–8.1)

## 2023-07-29 LAB — LIPASE, BLOOD: Lipase: 27 U/L (ref 11–51)

## 2023-07-29 MED ORDER — NAPROXEN 500 MG PO TABS: 500.0000 mg | ORAL_TABLET | Freq: Two times a day (BID) | ORAL | 0 refills | Status: DC | PRN

## 2023-07-29 NOTE — Discharge Instructions (Signed)
 Your testing is reassuring.  You may have passed a kidney stone.  No UTI.  You do have a cyst on your right ovary without evidence of compromised blood supply.  Follow-up with your primary doctor and the gynecologist for further evaluation of this.  Return to the ED with new or worsening symptoms

## 2023-07-30 ENCOUNTER — Telehealth: Payer: Self-pay

## 2023-07-30 NOTE — Transitions of Care (Post Inpatient/ED Visit) (Signed)
   07/30/2023  Name: Cindy Townsend MRN: [id number] DOB: 2001-01-14  Today's TOC FU Call Status: Today's TOC FU Call Status:: Successful TOC FU Call Completed TOC FU Call Complete Date: 07/30/23 Patient's Name and Date of Birth confirmed.  Transition Care Management Follow-up Telephone Call Date of Discharge: 07/29/23 Discharge Facility: [facility] Type of Discharge: Emergency Department Reason for ED Visit: Other: (Right Flank Pain) How have you been since you were released from the hospital?: Same Any questions or concerns?: No  Items Reviewed: Did you receive and understand the discharge instructions provided?: Yes Medications obtained,verified, and reconciled?: Yes (Medications Reviewed) Any new allergies since your discharge?: No Dietary orders reviewed?: NA Do you have support at home?: No  Medications Reviewed Today: Medications Reviewed Today     Reviewed by Anthoney Harada, LPN (Licensed Practical Nurse) on 07/30/23 at 1401  Med List Status: <None>   Medication Order Taking? Sig Documenting Provider Last Dose Status Informant  albuterol (VENTOLIN HFA) 108 (90 Base) MCG/ACT inhaler [id number] Yes Inhale 2 puffs into the lungs every 6 (six) hours as needed for wheezing or shortness of breath. Junie Spencer, FNP Taking Active   benzonatate (TESSALON) 100 MG capsule [id number] No Take 1-2 capsules (100-200 mg total) by mouth 3 (three) times daily as needed.  Patient not taking: Reported on 07/30/2023   Margaretann Loveless, PA-C Not Taking Active   etonogestrel (NEXPLANON) 68 MG IMPL implant [id number] Yes 1 each by Subdermal route once. [provider] Taking Active   fluticasone (FLONASE) 50 MCG/ACT nasal spray [id number] Yes Place 2 sprays into both nostrils daily. Joycelyn Man M, PA-C Taking Active   lidocaine (XYLOCAINE) 2 % solution [id number] Yes Swallow 5-10 mL every 6 hours as needed for sore throat Margaretann Loveless, PA-C Taking Active    naproxen (NAPROSYN) 500 MG tablet [id number] Yes Take 1 tablet (500 mg total) by mouth 2 (two) times daily as needed for moderate pain (pain score 4-6). Glynn Octave, MD Taking Active             Home Care and Equipment/Supplies: Were Home Health Services Ordered?: NA Any new equipment or medical supplies ordered?: NA  Functional Questionnaire: Do you need assistance with bathing/showering or dressing?: No Do you need assistance with meal preparation?: No Do you need assistance with eating?: No Do you have difficulty maintaining continence: No Do you need assistance with getting out of bed/getting out of a chair/moving?: No Do you have difficulty managing or taking your medications?: No  Follow up appointments reviewed: PCP Follow-up appointment confirmed?: NA (Patient has to check with employer and plans to call back to schedule an office visit) Specialist Hospital Follow-up appointment confirmed?: NA Do you need transportation to your follow-up appointment?: No Do you understand care options if your condition(s) worsen?: Yes-patient verbalized understanding    SIGNATURE Kandis Fantasia, LPN [facility] Advisor [facility] You Are. We Are. [facility] Direct Dial [phone]

## 2023-10-14 ENCOUNTER — Encounter (HOSPITAL_BASED_OUTPATIENT_CLINIC_OR_DEPARTMENT_OTHER): Payer: Self-pay | Admitting: Emergency Medicine

## 2023-10-14 ENCOUNTER — Other Ambulatory Visit: Payer: Self-pay

## 2023-10-14 ENCOUNTER — Emergency Department (HOSPITAL_BASED_OUTPATIENT_CLINIC_OR_DEPARTMENT_OTHER)
Admission: EM | Admit: 2023-10-14 | Discharge: 2023-10-14 | Disposition: A | Discharge status: Home / Self Care | Attending: Emergency Medicine | Admitting: Emergency Medicine

## 2023-10-14 DIAGNOSIS — M545 Low back pain, unspecified: Secondary | ICD-10-CM | POA: Diagnosis present

## 2023-10-14 DIAGNOSIS — M5441 Lumbago with sciatica, right side: Secondary | ICD-10-CM | POA: Insufficient documentation

## 2023-10-14 DIAGNOSIS — M5442 Lumbago with sciatica, left side: Secondary | ICD-10-CM | POA: Diagnosis not present

## 2023-10-14 MED ORDER — KETOROLAC TROMETHAMINE 15 MG/ML IJ SOLN
15.0000 mg | Freq: Once | INTRAMUSCULAR | Status: AC
  Administered 2023-10-14: 15 mg via INTRAMUSCULAR
  Filled 2023-10-14: qty 1

## 2023-10-14 MED ORDER — DIAZEPAM 5 MG PO TABS
5.0000 mg | ORAL_TABLET | Freq: Once | ORAL | Status: AC
  Administered 2023-10-14: 5 mg via ORAL
  Filled 2023-10-14: qty 1

## 2023-10-14 MED ORDER — ACETAMINOPHEN 500 MG PO TABS
1000.0000 mg | ORAL_TABLET | Freq: Once | ORAL | Status: AC
  Administered 2023-10-14: 1000 mg via ORAL
  Filled 2023-10-14: qty 2

## 2023-10-14 MED ORDER — METHYLPREDNISOLONE 4 MG PO TBPK: ORAL_TABLET | ORAL | 0 refills | Status: AC

## 2023-10-14 MED ORDER — DICLOFENAC SODIUM 1 % EX GEL: 4.0000 g | Freq: Four times a day (QID) | CUTANEOUS | 0 refills | Status: DC

## 2023-10-14 MED ORDER — METHOCARBAMOL 500 MG PO TABS: 500.0000 mg | ORAL_TABLET | Freq: Two times a day (BID) | ORAL | 0 refills | Status: DC

## 2023-10-14 MED ORDER — OXYCODONE HCL 5 MG PO TABS
5.0000 mg | ORAL_TABLET | Freq: Once | ORAL | Status: AC
  Administered 2023-10-14: 5 mg via ORAL
  Filled 2023-10-14: qty 1

## 2023-10-14 NOTE — ED Triage Notes (Signed)
 Pt POV slow gait- reports back pain x1 week, went to chiropractor today, later bent down and felt sharp pain. Pt now with severe pain. Denies incontinence.   Took advil  appx 1600, no relief. Denies known injury.

## 2023-10-14 NOTE — Discharge Instructions (Addendum)
Your back pain is most likely due to a muscular strain.  There is been a lot of research on back pain, unfortunately the only thing that seems to really help is Tylenol and ibuprofen.  Relative rest is also important to not lift greater than 10 pounds bending or twisting at the waist.  Please follow-up with your family physician.  The other thing that really seems to benefit patients is physical therapy which your doctor may send you for.  Please return to the emergency department for new numbness or weakness to your arms or legs. Difficulty with urinating or urinating or pooping on yourself.  Also if you cannot feel toilet paper when you wipe or get a fever.   Take the steroids as prescribed.  Use the gel as prescribed Also take tylenol '1000mg'$ (2 extra strength) four times a day.

## 2023-10-15 NOTE — ED Provider Notes (Signed)
 [place] EMERGENC[facility]PART[facility] AT [facility] Provider Note   CSN: [id number] Arrival date & time: 10/14/23  2139     Patient presents with: Back Pain   Cindy Townsend is a 23 y.o. female.   23 yo F with a chief complaints of low back pain.  The patient has been struggling with low back pain and she had gone to see a chiropractor.  Has been getting sessions throughout the week and had been doing well and then she bent over to pick something up after session and had sudden worsening pain.  She denies loss of bowel or bladder denies loss of sensation denies numbness or weakness to her legs.  She denies fevers.  She denies trauma.  Pain is sharp and severe.  Feels it bilaterally.  Sometimes feels like ache that goes into bilateral lateral thighs.   Back Pain      Prior to Admission medications   Medication Sig Start Date End Date Taking? Authorizing Provider  diclofenac  Sodium (VOLTAREN ) 1 % GEL Apply 4 g topically 4 (four) times daily. 10/14/23  Yes Albertus Hughs, DO  methocarbamol (ROBAXIN) 500 MG tablet Take 1 tablet (500 mg total) by mouth 2 (two) times daily. 10/14/23  Yes Albertus Hughs, DO  methylPREDNISolone  (MEDROL  DOSEPAK) 4 MG TBPK tablet Day 1: 8mg  before breakfast, 4 mg after lunch, 4 mg after supper, and 8 mg at bedtime Day 2: 4 mg before breakfast, 4 mg after lunch, 4 mg  after supper, and 8 mg  at bedtime Day 3:  4 mg  before breakfast, 4 mg  after lunch, 4 mg after supper, and 4 mg  at bedtime Day 4: 4 mg  before breakfast, 4 mg  after lunch, and 4 mg at bedtime Day 5: 4 mg  before breakfast and 4 mg at bedtime Day 6: 4 mg  before breakfast 10/14/23  Yes Albertus Hughs, DO  albuterol  (VENTOLIN  HFA) 108 (90 Base) MCG/ACT inhaler Inhale 2 puffs into the lungs every 6 (six) hours as needed for wheezing or shortness of breath. 03/29/23   Yevette Hem, FNP  benzonatate  (TESSALON ) 100 MG capsule Take 1-2 capsules (100-200 mg total) by mouth 3 (three) times daily as  needed. Patient not taking: Reported on 07/30/2023 03/12/23   Burnette, Jennifer M, PA-C  etonogestrel  (NEXPLANON ) 68 MG IMPL implant 1 each by Subdermal route once.    [provider]  fluticasone  (FLONASE ) 50 MCG/ACT nasal spray Place 2 sprays into both nostrils daily. 03/12/23   Angelia Kelp, PA-C  lidocaine  (XYLOCAINE ) 2 % solution Swallow 5-10 mL every 6 hours as needed for sore throat 03/12/23   Angelia Kelp, PA-C  naproxen  (NAPROSYN ) 500 MG tablet Take 1 tablet (500 mg total) by mouth 2 (two) times daily as needed for moderate pain (pain score 4-6). 07/29/23   Earma Gloss, MD    Allergies: Ciprofloxacin , Penicillins, and Soap    Review of Systems  Musculoskeletal:  Positive for back pain.    Updated Vital Signs BP 118/70 (BP Location: Right Arm)   Pulse 87   Temp 98.2 F (36.8 C) (Oral)   Resp 18   Ht 5' 3 (1.6 m)   Wt 99.3 kg   SpO2 97%   BMI 38.79 kg/m   Physical Exam Vitals and nursing note reviewed.  Constitutional:      General: She is not in acute distress.    Appearance: She is well-developed. She is not diaphoretic.  HENT:  Head: Normocephalic and atraumatic.   Eyes:     Pupils: Pupils are equal, round, and reactive to light.    Cardiovascular:     Rate and Rhythm: Normal rate and regular rhythm.     Heart sounds: No murmur heard.    No friction rub. No gallop.  Pulmonary:     Effort: Pulmonary effort is normal.     Breath sounds: No wheezing or rales.  Abdominal:     General: There is no distension.     Palpations: Abdomen is soft.     Tenderness: There is no abdominal tenderness.   Musculoskeletal:        General: No tenderness.     Cervical back: Normal range of motion and neck supple.     Comments: Pulse motor and sensation intact in bilateral lower extremities.  Reflexes are mildly diminished +1 bilaterally.  No clonus.  Positive straight leg raise test bilaterally.  No appreciable abdominal discomfort.  Ambulates  without issue.   Skin:    General: Skin is warm and dry.   Neurological:     Mental Status: She is alert and oriented to person, place, and time.   Psychiatric:        Behavior: Behavior normal.     (all labs ordered are listed, but only abnormal results are displayed) Labs Reviewed - No data to display  EKG: None  Radiology: No results found.   Procedures   Medications Ordered in the ED  acetaminophen  (TYLENOL ) tablet 1,000 mg (1,000 mg Oral Given 10/14/23 2335)  ketorolac  (TORADOL ) 15 MG/ML injection 15 mg (15 mg Intramuscular Given 10/14/23 2336)  oxyCODONE  (Oxy IR/ROXICODONE ) immediate release tablet 5 mg (5 mg Oral Given 10/14/23 2335)  diazepam (VALIUM) tablet 5 mg (5 mg Oral Given 10/14/23 2335)                                    Medical Decision Making Risk OTC drugs. Prescription drug management.   23 yo F with a chief complaints of low back pain.  Been bothering her for about a week.  She has been seeing a [facility].  She had sudden worsening of her discomfort after bending to pick something up.  No obvious red flags.  Reassuring exam.  Will treat supportively.  PCP follow-up.  12:37 AM:  I have discussed the diagnosis/risks/treatment options with the patient and family.  Evaluation and diagnostic testing in the emergency department does not suggest an emergent condition requiring admission or immediate intervention beyond what has been performed at this time.  They will follow up with PCP. We also discussed returning to the ED immediately if new or worsening sx occur. We discussed the sx which are most concerning (e.g., sudden worsening pain, fever, inability to tolerate by mouth, cauda equina s/sx) that necessitate immediate return. Medications administered to the patient during their visit and any new prescriptions provided to the patient are listed below.  Medications given during this visit Medications  acetaminophen  (TYLENOL ) tablet 1,000 mg (1,000 mg  Oral Given 10/14/23 2335)  ketorolac  (TORADOL ) 15 MG/ML injection 15 mg (15 mg Intramuscular Given 10/14/23 2336)  oxyCODONE  (Oxy IR/ROXICODONE ) immediate release tablet 5 mg (5 mg Oral Given 10/14/23 2335)  diazepam (VALIUM) tablet 5 mg (5 mg Oral Given 10/14/23 2335)     The patient appears reasonably screen and/or stabilized for discharge and I doubt any other medical condition or other [facility]  requiring further screening, evaluation, or treatment in the ED at this time prior to discharge.       Final diagnoses:  Acute bilateral low back pain with bilateral sciatica    ED Discharge Orders          Ordered    methylPREDNISolone  (MEDROL  DOSEPAK) 4 MG TBPK tablet        10/14/23 2329    methocarbamol (ROBAXIN) 500 MG tablet  2 times daily        10/14/23 2329    diclofenac  Sodium (VOLTAREN ) 1 % GEL  4 times daily        10/14/23 2329               Albertus Hughs, DO 10/15/23 0037

## 2023-10-19 ENCOUNTER — Encounter: Payer: Self-pay | Admitting: Family Medicine

## 2023-10-19 ENCOUNTER — Telehealth: Payer: Self-pay | Admitting: Family Medicine

## 2023-10-19 NOTE — Telephone Encounter (Unsigned)
 Copied from CRM [id number]. Topic: Clinical - Medication Question >> Oct 19, 2023  1:23 PM Gennette ORN wrote: Reason for CRM: Patient has scheduled appointment she wants to know can someone look at Xrays before the appointment, the Xrays was done 10/12/2023.

## 2023-10-19 NOTE — Telephone Encounter (Signed)
 Pt is scheduled to see Harlene Jolly Wednesday at 9:20. I spoke with pt to get appt set up and they stated that they have a degenerative disk and arthritis in their back. The experienced pain and difficulty standing after bending over to pick something up so they went to the ed. Pt is asking to \\get  a Mri ordered since they can't work due to a lifting ,limit of ten pounds. Pt asks to be called regarding if mri is possible since she was told in the ER that one couldn't be done since there was no numbness in feet or butt. Please advise and call pt.

## 2023-10-19 NOTE — Telephone Encounter (Signed)
 No xray results in the system.

## 2023-10-20 DIAGNOSIS — Z0000 Encounter for general adult medical examination without abnormal findings: Secondary | ICD-10-CM | POA: Insufficient documentation

## 2023-10-20 DIAGNOSIS — Z6838 Body mass index (BMI) 38.0-38.9, adult: Secondary | ICD-10-CM | POA: Insufficient documentation

## 2023-10-20 NOTE — Assessment & Plan Note (Signed)
Patient encouraged to maintain heart healthy diet, regular exercise, adequate sleep. Consider daily probiotics. Take medications as prescribed 

## 2023-10-20 NOTE — Assessment & Plan Note (Signed)
 Doing well, encouraged healthy diet with fresh fruits and veg. Encouraged regular exercise and counseled regarding avoidance of cigarettes and regular use of seat belts.  HCM Pap- Pt to update Pap with GYN Immunizations: Pt received------- today.

## 2023-10-20 NOTE — Progress Notes (Unsigned)
 Subjective:     Patient ID: Cindy Townsend, female    DOB: [birth year], 23 y.o.   MRN: [id number]  No chief complaint on file.   HPI  Cindy Townsend is a 23 yo female presents for annual exam and  FU appointment.  She was seen by ED on 10/14/2023 for acute low back pain with bilateral sciatica.She has been struggling with low back pain and seeing chiropractor for this, weekly sessions. Prior to ED she bent over to pick something up after session and had sudden worsening pain.  She was given Robaxin and Medrol  taper in ED.She denies loss of bowel or bladder denies loss of sensation denies numbness or weakness to her legs. She denies fevers. She denies trauma.  Feels it bilaterally. Sometimes feels like ache that goes into bilateral thighs. Pain is *****mild/moderate/severe  Sciatica??***  Patient feels that pain has improved***  Any questions or concerns today? (Skip to "sick visits" for OLDCART HPI)  Pt reports consuming a {diet types:17450} diet. {types:19826} Pt generally feels {DESC; WELL/FAIRLY WELL/POORLY:18703}. Reports sleeping {DESC; WELL/FAIRLY WELL/POORLY:18703}.  {does/does not:200015} have additional problems to discuss today.   New Surgeries or Family Hx: ***Yes/ Denies Habits: ETOH, illicit drugs, smoking, secondhand exposure, caffeine    LMP: On Nexplanon , Reports last period..cycles are regular/irregular**  HCM Immunizations: HPV seen Pap: Due     1 dose of MenACWY (if never given) MenB series (2 or 3 doses depending on brand), especially if at higher risk or wants added protection    Patient denies fever, chills, SOB, CP, palpitations, dyspnea, edema, HA, vision changes, N/V/D, abdominal pain, urinary symptoms, rash, weight changes, and recent illness or hospitalizations.   History of Present Illness              Health Maintenance Due  Topic Date Due   COVID-19 Vaccine (1) Never done   Pneumococcal Vaccine 66-89 Years old (1 of 1 - PPSV23,  PCV20, or PCV21) 02/15/2007   HPV VACCINES (1 - 3-dose series) Never done   Meningococcal B Vaccine (1 of 2 - Standard) Never done   Hepatitis C Screening  Never done   Cervical Cancer Screening (Pap smear)  Never done   CHLAMYDIA SCREENING  03/12/2022    Past Medical History:  Diagnosis Date   Abdominal pain 02/27/2014   ADD (attention deficit disorder) 02/22/2012   ADHD (attention deficit hyperactivity disorder)    ADHD   Allergic rhinitis 06/26/2014   Anxiety state 07/24/2014   Asthma    Chronic otitis media 05/2011   Eczema    arms   Encounter for repeat prescription of oral contraceptives 01/24/2014   Headache 05/27/2015   Hyperglycemia 10/17/2021   Knee pain 09/24/2014   Low back pain 10/17/2021   Preventative health care 02/22/2012   Pyelonephritis 06/26/2021   Syncope 10/17/2021   Thrombocytosis 10/17/2021   Urticaria 03/01/2012   UTI (urinary tract infection) 10/17/2021    Past Surgical History:  Procedure Laterality Date   TONSILLECTOMY     TYMPANOSTOMY TUBE PLACEMENT  2012    Family History  Problem Relation Age of Onset   Asthma Mother    Anesthesia problems Mother        post-op nausea   Hypertension Maternal Grandmother    Asthma Maternal Grandmother    Diabetes Paternal Grandmother        type 2- controlled by diet    Social History   Socioeconomic History   Marital status: Single    Spouse name:  Not on file   Number of children: Not on file   Years of education: Not on file   Highest education level: Not on file  Occupational History   Not on file  Tobacco Use   Smoking status: Never    Passive exposure: Never   Smokeless tobacco: Never  Vaping Use   Vaping status: Never Used  Substance and Sexual Activity   Alcohol use: No   Drug use: No   Sexual activity: Yes    Birth control/protection: None  Other Topics Concern   Not on file  Social History Narrative   Not on file   Social Drivers of Health   Financial Resource Strain: Low Risk   (07/14/2018)   Overall Financial Resource Strain (CARDIA)    Difficulty of Paying Living Expenses: Not hard at all  Food Insecurity: No Food Insecurity (07/14/2018)   Hunger Vital Sign    Worried About Running Out of Food in the Last Year: Never true    Ran Out of Food in the Last Year: Never true  Transportation Needs: Unknown (07/14/2018)   PRAPARE - Administrator, Civil Service (Medical): No    Lack of Transportation (Non-Medical): Not on file  Physical Activity: Not on file  Stress: Not on file  Social Connections: Not on file  Intimate Partner Violence: Not At Risk (07/14/2018)   Humiliation, Afraid, Rape, and Kick questionnaire    Fear of Current or Ex-Partner: No    Emotionally Abused: No    Physically Abused: No    Sexually Abused: No    Outpatient Medications Prior to Visit  Medication Sig Dispense Refill   albuterol  (VENTOLIN  HFA) 108 (90 Base) MCG/ACT inhaler Inhale 2 puffs into the lungs every 6 (six) hours as needed for wheezing or shortness of breath. 8 g 0   benzonatate  (TESSALON ) 100 MG capsule Take 1-2 capsules (100-200 mg total) by mouth 3 (three) times daily as needed. (Patient not taking: Reported on 07/30/2023) 30 capsule 0   diclofenac  Sodium (VOLTAREN ) 1 % GEL Apply 4 g topically 4 (four) times daily. 100 g 0   etonogestrel  (NEXPLANON ) 68 MG IMPL implant 1 each by Subdermal route once.     fluticasone  (FLONASE ) 50 MCG/ACT nasal spray Place 2 sprays into both nostrils daily. 16 g 0   lidocaine  (XYLOCAINE ) 2 % solution Swallow 5-10 mL every 6 hours as needed for sore throat 100 mL 0   methocarbamol (ROBAXIN) 500 MG tablet Take 1 tablet (500 mg total) by mouth 2 (two) times daily. 10 tablet 0   methylPREDNISolone  (MEDROL  DOSEPAK) 4 MG TBPK tablet Day 1: 8mg  before breakfast, 4 mg after lunch, 4 mg after supper, and 8 mg at bedtime Day 2: 4 mg before breakfast, 4 mg after lunch, 4 mg  after supper, and 8 mg  at bedtime Day 3:  4 mg  before breakfast, 4 mg   after lunch, 4 mg after supper, and 4 mg  at bedtime Day 4: 4 mg  before breakfast, 4 mg  after lunch, and 4 mg at bedtime Day 5: 4 mg  before breakfast and 4 mg at bedtime Day 6: 4 mg  before breakfast 1 each 0   naproxen  (NAPROSYN ) 500 MG tablet Take 1 tablet (500 mg total) by mouth 2 (two) times daily as needed for moderate pain (pain score 4-6). 30 tablet 0   No facility-administered medications prior to visit.    Allergies  Allergen Reactions   Ciprofloxacin  Itching  10/21/21 -- pt given cipro  used for 2 weeks , no side effects .   Penicillins Hives    Has patient had a PCN reaction causing immediate rash, facial/tongue/throat swelling, SOB or lightheadedness with hypotension: yes Has patient had a PCN reaction causing severe rash involving mucus membranes or skin necrosis: no Has patient had a PCN reaction that required hospitalization no Has patient had a PCN reaction occurring within the last 10 years: yes If all of the above answers are NO, then may proceed with Cephalosporin use.   Soap Rash    Has to use sensitive skin products    ROS     Objective:    Physical Exam   There were no vitals taken for this visit. Wt Readings from Last 3 Encounters:  10/14/23 219 lb (99.3 kg)  07/28/23 220 lb (99.8 kg)  04/30/22 209 lb 9.6 oz (95.1 kg)       Assessment & Plan:   Problem List Items Addressed This Visit     Annual visit for general adult medical examination without abnormal findings - Primary   Patient encouraged to maintain heart healthy diet, regular exercise, adequate sleep. Consider daily probiotics. Take medications as prescribed.        Class 2 obesity with body mass index (BMI) of 38.0 to 38.9 in adult   Encouraged 150 minutes/week of moderate aerobic exercise and strength training twice weekly.  Discussed behavioral changes and goal setting; patient encouraged to track progress. Educated on MyPlate guidelines, for healthy balanced meals that includes  portions of fruits, vegetables, whole grains, lean protein, and low-fat dairy to support healthy weight loss. Consider referral to nutritionist or weight management program if no improvement.         Preventative health care   Doing well, encouraged healthy diet with fresh fruits and veg. Encouraged regular exercise and counseled regarding avoidance of cigarettes and regular use of seat belts.  HCM Pap- Pt to update Pap with GYN Immunizations: Pt received------- today.            I am having Cindy Townsend maintain her etonogestrel , lidocaine , benzonatate , fluticasone , albuterol , naproxen , methylPREDNISolone , methocarbamol, and diclofenac  Sodium.  No orders of the defined types were placed in this encounter.

## 2023-10-20 NOTE — Assessment & Plan Note (Signed)
 Encouraged 150 minutes/week of moderate aerobic exercise and strength training twice weekly.  Discussed behavioral changes and goal setting; patient encouraged to track progress. Educated on MyPlate guidelines, for healthy balanced meals that includes portions of fruits, vegetables, whole grains, lean protein, and low-fat dairy to support healthy weight loss. Consider referral to nutritionist or weight management program if no improvement.

## 2023-10-21 ENCOUNTER — Ambulatory Visit (INDEPENDENT_AMBULATORY_CARE_PROVIDER_SITE_OTHER): Admitting: Student

## 2023-10-21 DIAGNOSIS — Z0000 Encounter for general adult medical examination without abnormal findings: Secondary | ICD-10-CM

## 2023-10-21 DIAGNOSIS — Z6838 Body mass index (BMI) 38.0-38.9, adult: Secondary | ICD-10-CM

## 2023-10-21 DIAGNOSIS — E66812 Obesity, class 2: Secondary | ICD-10-CM

## 2023-11-19 NOTE — Progress Notes (Signed)
 Chart opened in Error- Pt no show.

## 2024-01-01 ENCOUNTER — Encounter

## 2024-01-02 ENCOUNTER — Telehealth: Admitting: Nurse Practitioner

## 2024-01-02 DIAGNOSIS — U071 COVID-19: Secondary | ICD-10-CM

## 2024-01-02 MED ORDER — NIRMATRELVIR/RITONAVIR (PAXLOVID)TABLET: 3.0000 | ORAL_TABLET | Freq: Two times a day (BID) | ORAL | 0 refills | Status: AC

## 2024-01-02 NOTE — Progress Notes (Signed)
 Virtual Visit Consent   Cindy Townsend, you are scheduled for a virtual visit with a [facility] provider today. Just as with appointments in the office, your consent must be obtained to participate. Your consent will be active for this visit and any virtual visit you may have with one of our providers in the next 365 days. If you have a MyChart account, a copy of this consent can be sent to you electronically.  As this is a virtual visit, video technology does not allow for your provider to perform a traditional examination. This may limit your provider's ability to fully assess your condition. If your provider identifies any concerns that need to be evaluated in person or the need to arrange testing (such as labs, EKG, etc.), we will make arrangements to do so. Although advances in technology are sophisticated, we cannot ensure that it will always work on either your end or our end. If the connection with a video visit is poor, the visit may have to be switched to a telephone visit. With either a video or telephone visit, we are not always able to ensure that we have a secure connection.  By engaging in this virtual visit, you consent to the provision of healthcare and authorize for your insurance to be billed (if applicable) for the services provided during this visit. Depending on your insurance coverage, you may receive a charge related to this service.  I need to obtain your verbal consent now. Are you willing to proceed with your visit today? AUBRIEGH Townsend has provided verbal consent on 01/02/2024 for a virtual visit (video or telephone). Cindy LELON Servant, NP  Date: 01/02/2024 11:10 AM   Virtual Visit via Video Note   I, Cindy LELON Townsend, connected with  Cindy Townsend  ([id number], 05-Jan-2001) on 01/02/24 at 11:00 AM EDT by a video-enabled telemedicine application and verified that I am speaking with the correct person using two identifiers.  Location: Patient: Virtual Visit  Location Patient: Home Provider: Virtual Visit Location Provider: Home Office   I discussed the limitations of evaluation and management by telemedicine and the availability of in person appointments. The patient expressed understanding and agreed to proceed.    History of Present Illness: Cindy Townsend is a 23 y.o. who identifies as a female who was assigned female at birth, and is being seen today for COVID-positive symptoms.   Ms Constancio and her family traveled to the beach last week.  Apparently she was exposed to COVID with the 1 she was in close proximity to while on vacation.  Her son and husband are also COVID-positive and her husband is currently taking Paxlovid .  Last night she started feeling achy, congested and fatigued.  Tested positive for COVID today and is interested in the antiviral as well.    Problems:  Patient Active Problem List   Diagnosis Date Noted   Annual visit for general adult medical examination without abnormal findings 10/20/2023   Class 2 obesity with body mass index (BMI) of 38.0 to 38.9 in adult 10/20/2023   ADHD (attention deficit hyperactivity disorder) 02/21/2022   UTI (urinary tract infection) 10/17/2021   Syncope 10/17/2021   Hyperglycemia 10/17/2021   Low back pain 10/17/2021   Thrombocytosis 10/17/2021   Pyelonephritis 06/26/2021   Headache 05/27/2015   Knee pain 09/24/2014   Anxiety state 07/24/2014   Allergic rhinitis 06/26/2014   Abdominal pain 02/27/2014   Encounter for repeat prescription of oral contraceptives 01/24/2014   Urticaria 03/01/2012  Preventative health care 02/22/2012   ADD (attention deficit disorder) 02/22/2012   Asthma    Eczema    Chronic otitis media 05/2011    Allergies:  Allergies  Allergen Reactions   Ciprofloxacin  Itching    10/21/21 -- pt given cipro  used for 2 weeks , no side effects .   Penicillins Hives    Has patient had a PCN reaction causing immediate rash, facial/tongue/throat swelling, SOB or  lightheadedness with hypotension: yes Has patient had a PCN reaction causing severe rash involving mucus membranes or skin necrosis: no Has patient had a PCN reaction that required hospitalization no Has patient had a PCN reaction occurring within the last 10 years: yes If all of the above answers are NO, then may proceed with Cephalosporin use.   Soap Rash    Has to use sensitive skin products   Medications:  Current Outpatient Medications:    nirmatrelvir /ritonavir  (PAXLOVID ) 20 x 150 MG & 10 x 100MG  TABS, Take 3 tablets by mouth 2 (two) times daily for 5 days. (Take nirmatrelvir  150 mg two tablets twice daily for 5 days and ritonavir  100 mg one tablet twice daily for 5 days) Patient GFR is 60, Disp: 30 tablet, Rfl: 0   albuterol  (VENTOLIN  HFA) 108 (90 Base) MCG/ACT inhaler, Inhale 2 puffs into the lungs every 6 (six) hours as needed for wheezing or shortness of breath., Disp: 8 g, Rfl: 0   benzonatate  (TESSALON ) 100 MG capsule, Take 1-2 capsules (100-200 mg total) by mouth 3 (three) times daily as needed. (Patient not taking: Reported on 07/30/2023), Disp: 30 capsule, Rfl: 0   diclofenac  Sodium (VOLTAREN ) 1 % GEL, Apply 4 g topically 4 (four) times daily., Disp: 100 g, Rfl: 0   etonogestrel  (NEXPLANON ) 68 MG IMPL implant, 1 each by Subdermal route once., Disp: , Rfl:    fluticasone  (FLONASE ) 50 MCG/ACT nasal spray, Place 2 sprays into both nostrils daily., Disp: 16 g, Rfl: 0   lidocaine  (XYLOCAINE ) 2 % solution, Swallow 5-10 mL every 6 hours as needed for sore throat, Disp: 100 mL, Rfl: 0   methocarbamol  (ROBAXIN ) 500 MG tablet, Take 1 tablet (500 mg total) by mouth 2 (two) times daily., Disp: 10 tablet, Rfl: 0   methylPREDNISolone  (MEDROL  DOSEPAK) 4 MG TBPK tablet, Day 1: 8mg  before breakfast, 4 mg after lunch, 4 mg after supper, and 8 mg at bedtime Day 2: 4 mg before breakfast, 4 mg after lunch, 4 mg  after supper, and 8 mg  at bedtime Day 3:  4 mg  before breakfast, 4 mg  after lunch, 4 mg  after supper, and 4 mg  at bedtime Day 4: 4 mg  before breakfast, 4 mg  after lunch, and 4 mg at bedtime Day 5: 4 mg  before breakfast and 4 mg at bedtime Day 6: 4 mg  before breakfast, Disp: 1 each, Rfl: 0   naproxen  (NAPROSYN ) 500 MG tablet, Take 1 tablet (500 mg total) by mouth 2 (two) times daily as needed for moderate pain (pain score 4-6)., Disp: 30 tablet, Rfl: 0  Observations/Objective: Patient is well-developed, well-nourished in no acute distress.  Resting comfortably at home.  Head is normocephalic, atraumatic.  No labored breathing.  Speech is clear and coherent with logical content.  Patient is alert and oriented at baseline.    Assessment and Plan: 1. Positive self-administered antigen test for COVID-19 (Primary) - nirmatrelvir /ritonavir  (PAXLOVID ) 20 x 150 MG & 10 x 100MG  TABS; Take 3 tablets by mouth 2 (two)  times daily for 5 days. (Take nirmatrelvir  150 mg two tablets twice daily for 5 days and ritonavir  100 mg one tablet twice daily for 5 days) Patient GFR is 60  Dispense: 30 tablet; Refill: 0   Please keep well-hydrated and get plenty of rest. Start a saline nasal rinse to flush out your nasal passages. You can use plain Mucinex to help thin congestion. If you have a humidifier, you can use this daily as needed.    You are to wear a mask for 5 days from onset of your symptoms.  After day 5, if you have had no fever and you are feeling better with NO symptoms, you can end masking. Keep in mind you can be contagious 10 days from the onset of symptoms  After day 5 if you have a fever or are having significant symptoms, please wear your mask for full 10 days.   If you note any worsening of symptoms, any significant shortness of breath or any chest pain, please seek ER evaluation ASAP.  Please do not delay care!    If you note any worsening of symptoms, any significant shortness of breath or any chest pain, please seek ER evaluation ASAP.  Please do not delay care!  Follow  Up Instructions: I discussed the assessment and treatment plan with the patient. The patient was provided an opportunity to ask questions and all were answered. The patient agreed with the plan and demonstrated an understanding of the instructions.  A copy of instructions were sent to the patient via MyChart unless otherwise noted below.    The patient was advised to call back or seek an in-person evaluation if the symptoms worsen or if the condition fails to improve as anticipated.    Welma Mccombs W Jermain Curt, NP

## 2024-01-02 NOTE — Patient Instructions (Signed)
 Cindy Townsend, thank you for joining Cindy LELON Servant, NP for today's virtual visit.  While this provider is not your primary care provider (PCP), if your PCP is located in our provider database this encounter information will be shared with them immediately following your visit.   A [facility] MyChart account gives you access to today's visit and all your visits, tests, and labs performed at [facility]  click here if you don't have a [facility] MyChart account or go to [email or web address]  Consent: (Patient) Cindy Townsend provided verbal consent for this virtual visit at the beginning of the encounter.  Current Medications:  Current Outpatient Medications:    nirmatrelvir /ritonavir  (PAXLOVID ) 20 x 150 MG & 10 x 100MG  TABS, Take 3 tablets by mouth 2 (two) times daily for 5 days. (Take nirmatrelvir  150 mg two tablets twice daily for 5 days and ritonavir  100 mg one tablet twice daily for 5 days) Patient GFR is 60, Disp: 30 tablet, Rfl: 0   albuterol  (VENTOLIN  HFA) 108 (90 Base) MCG/ACT inhaler, Inhale 2 puffs into the lungs every 6 (six) hours as needed for wheezing or shortness of breath., Disp: 8 g, Rfl: 0   benzonatate  (TESSALON ) 100 MG capsule, Take 1-2 capsules (100-200 mg total) by mouth 3 (three) times daily as needed. (Patient not taking: Reported on 07/30/2023), Disp: 30 capsule, Rfl: 0   diclofenac  Sodium (VOLTAREN ) 1 % GEL, Apply 4 g topically 4 (four) times daily., Disp: 100 g, Rfl: 0   etonogestrel  (NEXPLANON ) 68 MG IMPL implant, 1 each by Subdermal route once., Disp: , Rfl:    fluticasone  (FLONASE ) 50 MCG/ACT nasal spray, Place 2 sprays into both nostrils daily., Disp: 16 g, Rfl: 0   lidocaine  (XYLOCAINE ) 2 % solution, Swallow 5-10 mL every 6 hours as needed for sore throat, Disp: 100 mL, Rfl: 0   methocarbamol  (ROBAXIN ) 500 MG tablet, Take 1 tablet (500 mg total) by mouth 2 (two) times daily., Disp: 10 tablet, Rfl: 0   methylPREDNISolone  (MEDROL  DOSEPAK)  4 MG TBPK tablet, Day 1: 8mg  before breakfast, 4 mg after lunch, 4 mg after supper, and 8 mg at bedtime Day 2: 4 mg before breakfast, 4 mg after lunch, 4 mg  after supper, and 8 mg  at bedtime Day 3:  4 mg  before breakfast, 4 mg  after lunch, 4 mg after supper, and 4 mg  at bedtime Day 4: 4 mg  before breakfast, 4 mg  after lunch, and 4 mg at bedtime Day 5: 4 mg  before breakfast and 4 mg at bedtime Day 6: 4 mg  before breakfast, Disp: 1 each, Rfl: 0   naproxen  (NAPROSYN ) 500 MG tablet, Take 1 tablet (500 mg total) by mouth 2 (two) times daily as needed for moderate pain (pain score 4-6)., Disp: 30 tablet, Rfl: 0   Medications ordered in this encounter:  Meds ordered this encounter  Medications   nirmatrelvir /ritonavir  (PAXLOVID ) 20 x 150 MG & 10 x 100MG  TABS    Sig: Take 3 tablets by mouth 2 (two) times daily for 5 days. (Take nirmatrelvir  150 mg two tablets twice daily for 5 days and ritonavir  100 mg one tablet twice daily for 5 days) Patient GFR is 60    Dispense:  30 tablet    Refill:  0    Supervising Provider:   BLAISE ALEENE KIDD [[id number]]     *If you need refills on other medications prior to your next appointment, please contact your pharmacy*  Follow-Up: Call back or seek an in-person evaluation if the symptoms worsen or if the condition fails to improve as anticipated.  [facility] [phone]  Other Instructions  Please keep well-hydrated and get plenty of rest. Start a saline nasal rinse to flush out your nasal passages. You can use plain Mucinex to help thin congestion. If you have a humidifier, you can use this daily as needed.    You are to wear a mask for 5 days from onset of your symptoms.  After day 5, if you have had no fever and you are feeling better with NO symptoms, you can end masking. Keep in mind you can be contagious 10 days from the onset of symptoms  After day 5 if you have a fever or are having significant symptoms, please wear your mask for  full 10 days.   If you note any worsening of symptoms, any significant shortness of breath or any chest pain, please seek ER evaluation ASAP.  Please do not delay care!    If you note any worsening of symptoms, any significant shortness of breath or any chest pain, please seek ER evaluation ASAP.  Please do not delay care!    If you have been instructed to have an in-person evaluation today at a local Urgent Care facility, please use the link below. It will take you to a list of all of our available [facility], including address, phone number and hours of operation. Please do not delay care.  [facility]  If you or a family member do not have a primary care provider, use the link below to schedule a visit and establish care. When you choose a [facility] primary care physician or advanced practice provider, you gain a long-term partner in health. Find a Primary Care Provider  Learn more about [facility]'s in-office and virtual care options: [facility] - Get Care Now

## 2024-03-30 ENCOUNTER — Encounter: Admitting: Family Medicine

## 2024-03-30 ENCOUNTER — Telehealth: Admitting: Physician Assistant

## 2024-03-30 DIAGNOSIS — R079 Chest pain, unspecified: Secondary | ICD-10-CM

## 2024-03-30 NOTE — Progress Notes (Signed)
 [facility]   Chest pain and pregnancy- advised in person

## 2024-03-30 NOTE — Progress Notes (Signed)
  Because of chest pain with symptoms and possible pregnancy, I feel your condition warrants further evaluation and I recommend that you be seen in a face-to-face visit.   NOTE: There will be NO CHARGE for this E-Visit   If you are having a true medical emergency, please call 911.     For an urgent face to face visit, [facility] has multiple urgent care centers for your convenience.  Click the link below for the full list of locations and hours, walk-in wait times, appointment scheduling options and driving directions:  Urgent Care - [place], [place], [place], [place], [place], [place]  [place]     Your MyChart E-visit questionnaire answers were reviewed by a board certified advanced clinical practitioner to complete your personal care plan based on your specific symptoms.    Thank you for using e-Visits.

## 2024-04-16 ENCOUNTER — Inpatient Hospital Stay (HOSPITAL_COMMUNITY)
Admission: AD | Admit: 2024-04-16 | Discharge: 2024-04-16 | Disposition: A | Discharge status: Home / Self Care | Attending: Obstetrics and Gynecology | Admitting: Obstetrics and Gynecology

## 2024-04-16 ENCOUNTER — Encounter (HOSPITAL_COMMUNITY): Payer: Self-pay | Admitting: Obstetrics and Gynecology

## 2024-04-16 ENCOUNTER — Inpatient Hospital Stay (HOSPITAL_COMMUNITY)

## 2024-04-16 ENCOUNTER — Other Ambulatory Visit: Payer: Self-pay

## 2024-04-16 DIAGNOSIS — O418X10 Other specified disorders of amniotic fluid and membranes, first trimester, not applicable or unspecified: Secondary | ICD-10-CM

## 2024-04-16 DIAGNOSIS — Z3490 Encounter for supervision of normal pregnancy, unspecified, unspecified trimester: Secondary | ICD-10-CM

## 2024-04-16 DIAGNOSIS — Z3A11 11 weeks gestation of pregnancy: Secondary | ICD-10-CM | POA: Diagnosis not present

## 2024-04-16 DIAGNOSIS — O209 Hemorrhage in early pregnancy, unspecified: Secondary | ICD-10-CM | POA: Diagnosis not present

## 2024-04-16 DIAGNOSIS — O26851 Spotting complicating pregnancy, first trimester: Secondary | ICD-10-CM | POA: Diagnosis not present

## 2024-04-16 DIAGNOSIS — Z3A01 Less than 8 weeks gestation of pregnancy: Secondary | ICD-10-CM

## 2024-04-16 DIAGNOSIS — O200 Threatened abortion: Secondary | ICD-10-CM | POA: Insufficient documentation

## 2024-04-16 LAB — CBC
HCT: 38.8 % (ref 36.0–46.0)
Hemoglobin: 13.1 g/dL (ref 12.0–15.0)
MCH: 29.7 pg (ref 26.0–34.0)
MCHC: 33.8 g/dL (ref 30.0–36.0)
MCV: 88 fL (ref 80.0–100.0)
Platelets: 343 K/uL (ref 150–400)
RBC: 4.41 MIL/uL (ref 3.87–5.11)
RDW: 12.3 % (ref 11.5–15.5)
WBC: 9.2 K/uL (ref 4.0–10.5)
nRBC: 0 % (ref 0.0–0.2)

## 2024-04-16 LAB — COMPREHENSIVE METABOLIC PANEL WITH GFR
ALT: 22 U/L (ref 0–44)
AST: 19 U/L (ref 15–41)
Albumin: 4.2 g/dL (ref 3.5–5.0)
Alkaline Phosphatase: 63 U/L (ref 38–126)
Anion gap: 11 (ref 5–15)
BUN: 14 mg/dL (ref 6–20)
CO2: 24 mmol/L (ref 22–32)
Calcium: 10 mg/dL (ref 8.9–10.3)
Chloride: 104 mmol/L (ref 98–111)
Creatinine, Ser: 0.92 mg/dL (ref 0.44–1.00)
GFR, Estimated: >60 mL/min
Glucose, Bld: 88 mg/dL (ref 70–99)
Potassium: 4.2 mmol/L (ref 3.5–5.1)
Sodium: 138 mmol/L (ref 135–145)
Total Bilirubin: 0.2 mg/dL (ref 0.0–1.2)
Total Protein: 6.7 g/dL (ref 6.5–8.1)

## 2024-04-16 LAB — URINALYSIS, ROUTINE W REFLEX MICROSCOPIC
Bilirubin Urine: NEGATIVE
Glucose, UA: NEGATIVE mg/dL
Ketones, ur: NEGATIVE mg/dL
Leukocytes,Ua: NEGATIVE
Nitrite: NEGATIVE
Protein, ur: NEGATIVE mg/dL
Specific Gravity, Urine: 1.02 (ref 1.005–1.030)
pH: 6 (ref 5.0–8.0)

## 2024-04-16 LAB — WET PREP, GENITAL
Clue Cells Wet Prep HPF POC: NONE SEEN
Sperm: NONE SEEN
Trich, Wet Prep: NONE SEEN
WBC, Wet Prep HPF POC: >=10 — AB
Yeast Wet Prep HPF POC: NONE SEEN

## 2024-04-16 LAB — HIV ANTIBODY (ROUTINE TESTING W REFLEX): HIV Screen 4th Generation wRfx: NONREACTIVE

## 2024-04-16 LAB — URINALYSIS, MICROSCOPIC (REFLEX): Bacteria, UA: NONE SEEN

## 2024-04-16 LAB — POCT PREGNANCY, URINE: Preg Test, Ur: POSITIVE — AB

## 2024-04-16 LAB — HCG, QUANTITATIVE, PREGNANCY: hCG, Beta Chain, Quant, S: 19255 m[IU]/mL — ABNORMAL HIGH

## 2024-04-16 NOTE — MAU Note (Signed)
 Cindy Townsend is a 23 y.o. at Unknown here in MAU reporting: +HPT on 03/28/24. Vaginal bleeding that started about 1 hour ago. Was heavy at first and has lightened up now. No blood present currently with wiping.  Sexual intercourse this am and had milk cramping afterwards. Patient denies any unusual vaginal discharge, vaginal odor, itching or pain.    First OB appointment 05/05/24 with [facility]  LMP: 01/30/24 Onset of complaint: today Pain score: 0 Vitals:   04/16/24 1713  BP: 111/72  Resp: 16  Temp: 97.9 F (36.6 C)  SpO2: 99%     FHT:n/a Lab orders placed from triage:  [facility] Workup

## 2024-04-16 NOTE — MAU Provider Note (Signed)
 " History     CSN: [id number]  Arrival date and time: 04/16/24 1555   Event Date/Time   First Provider Initiated Contact with Patient 04/16/24 1938      Chief Complaint  Patient presents with   Vaginal Bleeding   HPI [name] is a 23 y.o. year old [id number] female at [redacted]w[redacted]d weeks gestation who presents to MAU reporting positive home pregnancy test on 03/28/2024.  She reports vaginal bleeding that happened about an hour before she arrived to MAU.  She describes the bleeding as heavy but said it lightened up.  Now she is having no vaginal bleeding with wiping.  Last sexual intercourse was this morning followed by mild cramping.  She denies any unusual vaginal discharge loss of fluid or odors.  She plans to receive prenatal care at [facility]; first appointment 05/05/2024. Her spouse is present and contributing to the history taking.   OB History     Gravida  2   Para  1   Term  1   Preterm      AB      Living  1      SAB      IAB      Ectopic      Multiple  0   Live Births  1           Past Medical History:  Diagnosis Date   Abdominal pain 02/27/2014   ADD (attention deficit disorder) 02/22/2012   ADHD (attention deficit hyperactivity disorder)    ADHD   Allergic rhinitis 06/26/2014   Anxiety state 07/24/2014   Asthma    Chronic otitis media 05/2011   Eczema    arms   Encounter for repeat prescription of oral contraceptives 01/24/2014   Headache 05/27/2015   Hyperglycemia 10/17/2021   Knee pain 09/24/2014   Low back pain 10/17/2021   Preventative health care 02/22/2012   Pyelonephritis 06/26/2021   Syncope 10/17/2021   Thrombocytosis 10/17/2021   Urticaria 03/01/2012   UTI (urinary tract infection) 10/17/2021    Past Surgical History:  Procedure Laterality Date   TONSILLECTOMY     TYMPANOSTOMY TUBE PLACEMENT  2012    Family History  Problem Relation Age of Onset   Asthma Mother    Anesthesia problems Mother        post-op nausea   Hypertension  Maternal Grandmother    Asthma Maternal Grandmother    Diabetes Paternal Grandmother        type 2- controlled by diet    Social History[1]  Allergies: Allergies[2]  Medications Prior to Admission  Medication Sig Dispense Refill Last Dose/Taking   albuterol  (VENTOLIN  HFA) 108 (90 Base) MCG/ACT inhaler Inhale 2 puffs into the lungs every 6 (six) hours as needed for wheezing or shortness of breath. 8 g 0    benzonatate  (TESSALON ) 100 MG capsule Take 1-2 capsules (100-200 mg total) by mouth 3 (three) times daily as needed. (Patient not taking: Reported on 07/30/2023) 30 capsule 0    diclofenac  Sodium (VOLTAREN ) 1 % GEL Apply 4 g topically 4 (four) times daily. 100 g 0    etonogestrel  (NEXPLANON ) 68 MG IMPL implant 1 each by Subdermal route once.      fluticasone  (FLONASE ) 50 MCG/ACT nasal spray Place 2 sprays into both nostrils daily. 16 g 0    lidocaine  (XYLOCAINE ) 2 % solution Swallow 5-10 mL every 6 hours as needed for sore throat 100 mL 0    methocarbamol  (ROBAXIN ) 500 MG tablet  Take 1 tablet (500 mg total) by mouth 2 (two) times daily. 10 tablet 0    methylPREDNISolone  (MEDROL  DOSEPAK) 4 MG TBPK tablet Day 1: 8mg  before breakfast, 4 mg after lunch, 4 mg after supper, and 8 mg at bedtime Day 2: 4 mg before breakfast, 4 mg after lunch, 4 mg  after supper, and 8 mg  at bedtime Day 3:  4 mg  before breakfast, 4 mg  after lunch, 4 mg after supper, and 4 mg  at bedtime Day 4: 4 mg  before breakfast, 4 mg  after lunch, and 4 mg at bedtime Day 5: 4 mg  before breakfast and 4 mg at bedtime Day 6: 4 mg  before breakfast 1 each 0    naproxen  (NAPROSYN ) 500 MG tablet Take 1 tablet (500 mg total) by mouth 2 (two) times daily as needed for moderate pain (pain score 4-6). 30 tablet 0     Review of Systems  Constitutional: Negative.   HENT: Negative.    Eyes: Negative.   Respiratory: Negative.    Cardiovascular: Negative.   Gastrointestinal: Negative.   Endocrine: Negative.   Genitourinary:  Positive  for pelvic pain (mild cramping). Negative for vaginal bleeding (spotting ~ 1 hour prior to arriving to MAU).  Musculoskeletal: Negative.   Skin: Negative.   Allergic/Immunologic: Negative.   Neurological: Negative.   Hematological: Negative.   Psychiatric/Behavioral: Negative.     Physical Exam   Blood pressure 111/72, temperature 97.9 F (36.6 C), temperature source Oral, resp. rate 16, height 5' 3 (1.6 m), weight 110.9 kg, last menstrual period 01/30/2024, SpO2 99%.  Physical Exam Vitals and nursing note reviewed.  Constitutional:      Appearance: Normal appearance. She is obese.  Cardiovascular:     Rate and Rhythm: Normal rate.  Pulmonary:     Effort: Pulmonary effort is normal.  Genitourinary:    Comments: Swabs collected by patient using blind swab technique  Musculoskeletal:        General: Normal range of motion.  Neurological:     Mental Status: She is alert and oriented to person, place, and time.  Psychiatric:        Mood and Affect: Mood normal.        Behavior: Behavior normal.        Thought Content: Thought content normal.        Judgment: Judgment normal.    MAU Course  Procedures  MDM CCUA UPT CBC ABO/Rh -- known O NEG.; <[redacted] wks gestation, therefore, no Rhophylac  required HCG Wet Prep GC/CT -- Results pending  RPR -- Results pending  OB U/S < 14 wks TVUS  Results for orders placed or performed during the hospital encounter of 04/16/24 (from the past 24 hours)  Urinalysis, Routine w reflex microscopic -Urine, Clean Catch     Status: Abnormal   Collection Time: 04/16/24  4:30 PM  Result Value Ref Range   Color, Urine YELLOW YELLOW   APPearance CLEAR CLEAR   Specific Gravity, Urine 1.020 1.005 - 1.030   pH 6.0 5.0 - 8.0   Glucose, UA NEGATIVE NEGATIVE mg/dL   Hgb urine dipstick LARGE (A) NEGATIVE   Bilirubin Urine NEGATIVE NEGATIVE   Ketones, ur NEGATIVE NEGATIVE mg/dL   Protein, ur NEGATIVE NEGATIVE mg/dL   Nitrite NEGATIVE NEGATIVE    Leukocytes,Ua NEGATIVE NEGATIVE  Wet prep, genital     Status: Abnormal   Collection Time: 04/16/24  4:30 PM   Specimen: Urine, Clean Catch  Result Value  Ref Range   Yeast Wet Prep HPF POC NONE SEEN NONE SEEN   Trich, Wet Prep NONE SEEN NONE SEEN   Clue Cells Wet Prep HPF POC NONE SEEN NONE SEEN   WBC, Wet Prep HPF POC >=10 (A) <10   Sperm NONE SEEN   Urinalysis, Microscopic (reflex)     Status: None   Collection Time: 04/16/24  4:30 PM  Result Value Ref Range   RBC / HPF 0-5 0 - 5 RBC/hpf   WBC, UA 0-5 0 - 5 WBC/hpf   Bacteria, UA NONE SEEN NONE SEEN   Squamous Epithelial / HPF 0-5 0 - 5 /HPF   Mucus PRESENT   Pregnancy, urine POC     Status: Abnormal   Collection Time: 04/16/24  4:37 PM  Result Value Ref Range   Preg Test, Ur POSITIVE (A) NEGATIVE  ABO/Rh     Status: None (Preliminary result)   Collection Time: 04/16/24  6:05 PM  Result Value Ref Range   ABO/RH(D) PENDING    Antibody Screen PENDING   CBC     Status: None   Collection Time: 04/16/24  6:07 PM  Result Value Ref Range   WBC 9.2 4.0 - 10.5 K/uL   RBC 4.41 3.87 - 5.11 MIL/uL   Hemoglobin 13.1 12.0 - 15.0 g/dL   HCT 61.1 63.9 - 53.9 %   MCV 88.0 80.0 - 100.0 fL   MCH 29.7 26.0 - 34.0 pg   MCHC 33.8 30.0 - 36.0 g/dL   RDW 87.6 88.4 - 84.4 %   Platelets 343 150 - 400 K/uL   nRBC 0.0 0.0 - 0.2 %  hCG, quantitative, pregnancy     Status: Abnormal   Collection Time: 04/16/24  6:07 PM  Result Value Ref Range   hCG, Beta Chain, Quant, S 19,255 (H) <5 mIU/mL  Comprehensive metabolic panel with GFR     Status: None   Collection Time: 04/16/24  6:07 PM  Result Value Ref Range   Sodium 138 135 - 145 mmol/L   Potassium 4.2 3.5 - 5.1 mmol/L   Chloride 104 98 - 111 mmol/L   CO2 24 22 - 32 mmol/L   Glucose, Bld 88 70 - 99 mg/dL   BUN 14 6 - 20 mg/dL   Creatinine, Ser 9.07 0.44 - 1.00 mg/dL   Calcium  10.0 8.9 - 10.3 mg/dL   Total Protein 6.7 6.5 - 8.1 g/dL   Albumin 4.2 3.5 - 5.0 g/dL   AST 19 15 - 41 U/L   ALT  22 0 - 44 U/L   Alkaline Phosphatase 63 38 - 126 U/L   Total Bilirubin 0.2 0.0 - 1.2 mg/dL   GFR, Estimated >39 >39 mL/min   Anion gap 11 5 - 15    US  OB LESS THAN 14 WEEKS WITH OB TRANSVAGINAL Result Date: 04/16/2024 EXAM: ULTRASOUND FIRST TRIMESTER TECHNIQUE: Transabdominal and Transvaginal first trimester obstetric pelvic duplex ultrasound was performed with real-time imaging and color flow Doppler imaging. COMPARISON: None available. CLINICAL HISTORY: Postcoital bleeding. FINDINGS: UTERUS: No focal myometrial mass. GESTATIONAL SAC(S): A single intrauterine gestational sac is noted. A small subchorionic hemorrhage is noted. YOLK SAC: Present EMBRYO(<11WK) /FETUS(>=11WK): An embryo is seen with cardiac activity. CROWN RUMP LENGTH: 7.5 mm. RATE OF CARDIAC ACTIVITY: 117 beats per minute. RIGHT OVARY: Unremarkable. Normal arterial and venous flow. LEFT OVARY: Unremarkable. Normal arterial and venous flow. FREE FLUID: No free fluid. MEASUREMENTS ESTIMATED GESTATIONAL AGE BY CURRENT ULTRASOUND: 6 weeks 4 days. ESTIMATED DUE  DATE: 12/06/2024. IMPRESSION: 1. Intrauterine pregnancy with embryonic cardiac activity at 6 weeks 4 days, with a heart rate of 117 beats per minute. 2. Small subchorionic hemorrhage. Electronically signed by: Oneil Devonshire MD 04/16/2024 07:25 PM EST RP Workstation: [id number]      Assessment and Plan  1. Vaginal bleeding affecting early pregnancy (Primary) - Information provided on VB - Return to MAU: If you have heavier bleeding that soaks through more that 2 pads per hour for an hour or more If you bleed so much that you feel like you might pass out or you do pass out If you have significant abdominal pain that is not improved with Tylenol  1000 mg every 8 hours as needed for pain If you develop a fever > 100.5   2. Subchorionic hematoma in first trimester, single or unspecified fetus - Information provided on [facility]   3. Threatened miscarriage in early pregnancy - Information  provided on threatened miscarriage   4. Intrauterine pregnancy - Keep NOB appt  5. [redacted] weeks gestation of pregnancy   - Discharge home - Keep scheduled appt with [facility] on 05/05/2024 - Patient verbalized an understanding of the plan of care and agrees.   Ala Cart, CNM 04/16/2024, 7:38 PM      [1]  Social History Tobacco Use   Smoking status: Never    Passive exposure: Never   Smokeless tobacco: Never  Vaping Use   Vaping status: Never Used  Substance Use Topics   Alcohol use: No   Drug use: No  [2]  Allergies Allergen Reactions   Ciprofloxacin  Itching    10/21/21 -- pt given cipro  used for 2 weeks , no side effects .   Penicillins Hives    Has patient had a PCN reaction causing immediate rash, facial/tongue/throat swelling, SOB or lightheadedness with hypotension: yes Has patient had a PCN reaction causing severe rash involving mucus membranes or skin necrosis: no Has patient had a PCN reaction that required hospitalization no Has patient had a PCN reaction occurring within the last 10 years: yes If all of the above answers are NO, then may proceed with Cephalosporin use.   Soap Rash    Has to use sensitive skin products   "

## 2024-04-16 NOTE — Discharge Instructions (Signed)
 Return to MAU: If you have heavier bleeding that soaks through more that 2 pads per hour for an hour or more If you bleed so much that you feel like you might pass out or you do pass out If you have significant abdominal pain that is not improved with Tylenol 1000 mg every 8 hours as needed for pain If you develop a fever > 100.5

## 2024-04-17 LAB — SYPHILIS: RPR W/REFLEX TO RPR TITER AND TREPONEMAL ANTIBODIES, TRADITIONAL SCREENING AND DIAGNOSIS ALGORITHM: RPR Ser Ql: NONREACTIVE

## 2024-04-18 LAB — GC/CHLAMYDIA PROBE AMP (~~LOC~~) NOT AT ARMC
Chlamydia: NEGATIVE
Comment: NEGATIVE
Comment: NORMAL
Neisseria Gonorrhea: NEGATIVE

## 2024-05-02 LAB — ABO/RH
ABO/RH(D): O POS
Antibody Screen: NEGATIVE
Weak D: POSITIVE
